# Patient Record
Sex: Male | Born: 2000 | Race: White | Hispanic: No | Marital: Single | Smoking: Former smoker
Health system: Southern US, Community
[De-identification: ages and names within clinical notes are randomized; demographics above are authoritative.]

## PROBLEM LIST (undated history)

## (undated) DIAGNOSIS — T7840XA Allergy, unspecified, initial encounter: Secondary | ICD-10-CM

## (undated) DIAGNOSIS — J309 Allergic rhinitis, unspecified: Secondary | ICD-10-CM

## (undated) DIAGNOSIS — L740 Miliaria rubra: Secondary | ICD-10-CM

## (undated) DIAGNOSIS — M303 Mucocutaneous lymph node syndrome [Kawasaki]: Secondary | ICD-10-CM

## (undated) HISTORY — PX: TONSILLECTOMY: SUR1361

## (undated) HISTORY — DX: Miliaria rubra: L74.0

## (undated) HISTORY — DX: Allergy, unspecified, initial encounter: T78.40XA

## (undated) HISTORY — PX: TESTICLE SURGERY: SHX794

---
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
## 2019-01-01 LAB — LIPID PANEL
CHOL/HDL RATIO: 2.9 ratio (ref 0.0–5.0)
Cholesterol, Total: 179 mg/dL — ABNORMAL HIGH (ref 100–169)
HDL: 61 mg/dL (ref >39)
LDL CALC: 83 mg/dL (ref 0–109)
TRIGLYCERIDES: 175 mg/dL — AB (ref 0–89)
VLDL CHOLESTEROL CAL: 35 mg/dL (ref 5–40)

## 2019-01-01 LAB — RENAL FUNCTION PANEL
ALBUMIN: 4.6 g/dL (ref 4.1–5.2)
BUN/Creatinine Ratio: 20 (ref 9–20)
BUN: 15 mg/dL (ref 6–20)
CO2: 22 mmol/L (ref 20–29)
CREATININE: 0.76 mg/dL (ref 0.76–1.27)
Calcium: 9.6 mg/dL (ref 8.7–10.2)
Chloride: 102 mmol/L (ref 96–106)
GFR calc Af Amer: 154 mL/min/{1.73_m2} (ref >59)
GFR, EST NON AFRICAN AMERICAN: 133 mL/min/{1.73_m2} (ref >59)
Glucose: 94 mg/dL (ref 65–99)
Phosphorus: 3.1 mg/dL — ABNORMAL LOW (ref 3.4–5.5)
Potassium: 4.3 mmol/L (ref 3.5–5.2)
SODIUM: 139 mmol/L (ref 134–144)

## 2019-01-08 ENCOUNTER — Ambulatory Visit (INDEPENDENT_AMBULATORY_CARE_PROVIDER_SITE_OTHER): Payer: Medicaid Other | Admitting: Family Medicine

## 2019-01-08 ENCOUNTER — Encounter: Payer: Self-pay | Admitting: Family Medicine

## 2019-01-08 VITALS — BP 120/80 | HR 80 | Ht 71.0 in | Wt 174.0 lb

## 2019-01-08 DIAGNOSIS — R059 Cough, unspecified: Secondary | ICD-10-CM

## 2019-01-08 DIAGNOSIS — J0140 Acute pansinusitis, unspecified: Secondary | ICD-10-CM

## 2019-01-08 DIAGNOSIS — R05 Cough: Secondary | ICD-10-CM | POA: Diagnosis not present

## 2019-01-08 MED ORDER — GUAIFENESIN-CODEINE 100-10 MG/5ML PO SYRP: 5.0000 mL | ORAL_SOLUTION | Freq: Three times a day (TID) | ORAL | 0 refills | Status: DC | PRN

## 2019-01-08 MED ORDER — AMOXICILLIN-POT CLAVULANATE 875-125 MG PO TABS: 1.0000 | ORAL_TABLET | Freq: Two times a day (BID) | ORAL | 0 refills | Status: DC

## 2019-01-08 NOTE — Progress Notes (Signed)
Date:  01/08/2019   Name:  Kenneth Lucas   DOB:  11-09-2001   MRN:  [id number]   Chief Complaint: Sinusitis (cough, headache, chills on and off)  Sinusitis  This is a new problem. The current episode started in the past 7 days (Sunday). The problem has been gradually worsening since onset. There has been no fever. Associated symptoms include chills, congestion, coughing, headaches, sinus pressure, sneezing and a sore throat. Pertinent negatives include no diaphoresis, ear pain, hoarse voice, neck pain, shortness of breath or swollen glands. (Prod yellow) Past treatments include oral decongestants (inhaler). The treatment provided mild relief.    Review of Systems  Constitutional: Positive for chills. Negative for diaphoresis and fever.  HENT: Positive for congestion, sinus pressure, sneezing and sore throat. Negative for drooling, ear discharge, ear pain and hoarse voice.   Respiratory: Positive for cough. Negative for shortness of breath and wheezing.   Cardiovascular: Negative for chest pain, palpitations and leg swelling.  Gastrointestinal: Negative for abdominal pain, blood in stool, constipation, diarrhea and nausea.  Endocrine: Negative for polydipsia.  Genitourinary: Negative for dysuria, frequency, hematuria and urgency.  Musculoskeletal: Negative for back pain, myalgias and neck pain.  Skin: Negative for rash.  Allergic/Immunologic: Negative for environmental allergies.  Neurological: Positive for headaches. Negative for dizziness.  Hematological: Does not bruise/bleed easily.  Psychiatric/Behavioral: Negative for suicidal ideas. The patient is not nervous/anxious.     Patient Active Problem List   Diagnosis Date Noted  . Kawasaki disease (HCC) 04/04/2013    No Known Allergies  Past Surgical History:  Procedure Laterality Date  . TESTICLE SURGERY    . TONSILLECTOMY      Social History   Tobacco Use  . Smoking status: Current Some Day Smoker    Types:  E-cigarettes  . Smokeless tobacco: Never Used  Substance Use Topics  . Alcohol use: Yes    Comment: minimal  . Drug use: Yes    Types: Marijuana     Medication list has been reviewed and updated.  Current Meds  Medication Sig  . albuterol (PROVENTIL HFA;VENTOLIN HFA) 108 (90 Base) MCG/ACT inhaler Inhale 2 puffs into the lungs every 6 (six) hours as needed for wheezing or shortness of breath.  . fexofenadine (ALLEGRA) 180 MG tablet Take 180 mg by mouth daily. otc  . fluticasone (FLONASE) 50 MCG/ACT nasal spray Place 2 sprays into both nostrils daily as needed for allergies or rhinitis.    PHQ 2/9 Scores 12/03/2018  PHQ - 2 Score 0  PHQ- 9 Score 4    Physical Exam Vitals signs and nursing note reviewed.  HENT:     Head: Normocephalic.     Jaw: There is normal jaw occlusion.     Right Ear: Hearing, tympanic membrane, ear canal and external ear normal.     Left Ear: Hearing, tympanic membrane, ear canal and external ear normal.     Nose:     Right Turbinates: Swollen.     Left Turbinates: Swollen.     Right Sinus: Maxillary sinus tenderness and frontal sinus tenderness present.     Left Sinus: Maxillary sinus tenderness and frontal sinus tenderness present.  Eyes:     General: No scleral icterus.       Right eye: No discharge.        Left eye: No discharge.     Conjunctiva/sclera: Conjunctivae normal.     Pupils: Pupils are equal, round, and reactive to light.  Neck:  Musculoskeletal: Normal range of motion and neck supple.     Thyroid: No thyromegaly.     Vascular: No JVD.     Trachea: No tracheal deviation.  Cardiovascular:     Rate and Rhythm: Normal rate and regular rhythm.     Heart sounds: Normal heart sounds. No murmur. No friction rub. No gallop.   Pulmonary:     Effort: No respiratory distress.     Breath sounds: Normal breath sounds. No decreased breath sounds, wheezing, rhonchi or rales.  Abdominal:     General: Bowel sounds are normal.     Palpations:  Abdomen is soft. There is no mass.     Tenderness: There is no abdominal tenderness. There is no guarding or rebound.  Musculoskeletal: Normal range of motion.        General: No tenderness.  Lymphadenopathy:     Head:     Right side of head: Submandibular adenopathy present.     Left side of head: Submandibular adenopathy present.     Cervical: No cervical adenopathy.     Right cervical: No superficial or posterior cervical adenopathy.    Left cervical: No superficial or posterior cervical adenopathy.  Skin:    General: Skin is warm.     Findings: No rash.  Neurological:     Mental Status: He is alert and oriented to person, place, and time.     Cranial Nerves: No cranial nerve deficit.     Deep Tendon Reflexes: Reflexes are normal and symmetric.     BP 120/80   Pulse 80   Ht 5\' 11"  (1.803 m)   Wt 174 lb (78.9 kg)   BMI 24.27 kg/m   Assessment and Plan: 1. Acute pansinusitis, recurrence not specified Acute.  Persistent.  Will initiate Augmentin 875 twice a day for 10 days - amoxicillin-clavulanate (AUGMENTIN) 875-125 MG tablet; Take 1 tablet by mouth 2 (two) times daily.  Dispense: 20 tablet; Refill: 0  2. Cough Recurrent cough particularly nightly.  Will initiate Robitussin-AC 1 teaspoon every 6 hours as needed cough. - guaiFENesin-codeine (ROBITUSSIN AC) 100-10 MG/5ML syrup; Take 5 mLs by mouth 3 (three) times daily as needed for cough.  Dispense: 118 mL; Refill: 0

## 2019-02-12 DIAGNOSIS — M791 Myalgia, unspecified site: Secondary | ICD-10-CM | POA: Diagnosis not present

## 2019-02-12 DIAGNOSIS — J029 Acute pharyngitis, unspecified: Secondary | ICD-10-CM | POA: Diagnosis not present

## 2019-02-12 DIAGNOSIS — R509 Fever, unspecified: Secondary | ICD-10-CM | POA: Diagnosis not present

## 2019-02-12 DIAGNOSIS — K529 Noninfective gastroenteritis and colitis, unspecified: Secondary | ICD-10-CM | POA: Diagnosis not present

## 2019-02-17 ENCOUNTER — Other Ambulatory Visit: Payer: Self-pay

## 2019-02-17 ENCOUNTER — Ambulatory Visit
Admission: EM | Admit: 2019-02-17 | Discharge: 2019-02-17 | Disposition: A | Discharge status: Home / Self Care | Payer: Medicaid Other | Attending: Emergency Medicine | Admitting: Emergency Medicine

## 2019-02-17 ENCOUNTER — Ambulatory Visit: Payer: Medicaid Other

## 2019-02-17 ENCOUNTER — Encounter: Payer: Self-pay | Admitting: Gynecology

## 2019-02-17 DIAGNOSIS — R1012 Left upper quadrant pain: Secondary | ICD-10-CM | POA: Diagnosis present

## 2019-02-17 DIAGNOSIS — K5900 Constipation, unspecified: Secondary | ICD-10-CM | POA: Diagnosis not present

## 2019-02-17 LAB — COMPREHENSIVE METABOLIC PANEL
ALBUMIN: 4.6 g/dL (ref 3.5–5.0)
ALK PHOS: 61 U/L (ref 38–126)
ALT: 25 U/L (ref 0–44)
AST: 22 U/L (ref 15–41)
Anion gap: 7 (ref 5–15)
BILIRUBIN TOTAL: 0.5 mg/dL (ref 0.3–1.2)
BUN: 15 mg/dL (ref 6–20)
CALCIUM: 8.7 mg/dL — AB (ref 8.9–10.3)
CO2: 24 mmol/L (ref 22–32)
Chloride: 101 mmol/L (ref 98–111)
Creatinine, Ser: 0.66 mg/dL (ref 0.61–1.24)
GFR calc Af Amer: >60 mL/min (ref >60)
GFR calc non Af Amer: >60 mL/min (ref >60)
Glucose, Bld: 101 mg/dL — ABNORMAL HIGH (ref 70–99)
Potassium: 3.7 mmol/L (ref 3.5–5.1)
Sodium: 132 mmol/L — ABNORMAL LOW (ref 135–145)
TOTAL PROTEIN: 7.9 g/dL (ref 6.5–8.1)

## 2019-02-17 LAB — CBC WITH DIFFERENTIAL/PLATELET
ABS IMMATURE GRANULOCYTES: 0.02 10*3/uL (ref 0.00–0.07)
Basophils Absolute: 0 10*3/uL (ref 0.0–0.1)
Basophils Relative: 1 %
Eosinophils Absolute: 0.2 10*3/uL (ref 0.0–0.5)
Eosinophils Relative: 3 %
HEMATOCRIT: 44.1 % (ref 39.0–52.0)
HEMOGLOBIN: 15.1 g/dL (ref 13.0–17.0)
IMMATURE GRANULOCYTES: 0 %
LYMPHS ABS: 1.8 10*3/uL (ref 0.7–4.0)
Lymphocytes Relative: 29 %
MCH: 28 pg (ref 26.0–34.0)
MCHC: 34.2 g/dL (ref 30.0–36.0)
MCV: 81.7 fL (ref 80.0–100.0)
Monocytes Absolute: 0.5 10*3/uL (ref 0.1–1.0)
Monocytes Relative: 7 %
NEUTROS ABS: 3.6 10*3/uL (ref 1.7–7.7)
NEUTROS PCT: 60 %
Platelets: 231 10*3/uL (ref 150–400)
RBC: 5.4 MIL/uL (ref 4.22–5.81)
RDW: 12.4 % (ref 11.5–15.5)
WBC: 6.1 10*3/uL (ref 4.0–10.5)
nRBC: 0 % (ref 0.0–0.2)

## 2019-02-17 LAB — LIPASE, BLOOD: Lipase: 30 U/L (ref 11–51)

## 2019-02-17 MED ORDER — PANTOPRAZOLE SODIUM 20 MG PO TBEC: 20.0000 mg | DELAYED_RELEASE_TABLET | Freq: Every day | ORAL | 0 refills | Status: DC

## 2019-02-17 MED ORDER — IOHEXOL 300 MG/ML  SOLN
100.0000 mL | Freq: Once | INTRAMUSCULAR | Status: AC | PRN
  Administered 2019-02-17: 100 mL via INTRAVENOUS

## 2019-02-17 MED ORDER — ONDANSETRON 8 MG PO TBDP
8.0000 mg | ORAL_TABLET | Freq: Once | ORAL | Status: AC
  Administered 2019-02-17: 8 mg via ORAL

## 2019-02-17 MED ORDER — IBUPROFEN 600 MG PO TABS: 600.0000 mg | ORAL_TABLET | Freq: Four times a day (QID) | ORAL | 0 refills | Status: DC | PRN

## 2019-02-17 MED ORDER — KETOROLAC TROMETHAMINE 60 MG/2ML IM SOLN
30.0000 mg | Freq: Once | INTRAMUSCULAR | Status: AC
  Administered 2019-02-17: 30 mg via INTRAMUSCULAR

## 2019-02-17 MED ORDER — FAMOTIDINE 20 MG PO TABS: 20.0000 mg | ORAL_TABLET | Freq: Two times a day (BID) | ORAL | 0 refills | Status: DC

## 2019-02-17 NOTE — ED Triage Notes (Signed)
Per patient was seen at [facility] ER x 1 week for stomach pain. Per patient was tested flu / strep and blood work  which was negative. Per patient still having the stomach issue

## 2019-02-17 NOTE — Discharge Instructions (Signed)
Try the Pepcid and Protonix, and eating a healthier diet.  600 mg ibuprofen combined with 1 g of Tylenol together 3-4 times a day as needed for pain.  You can try MiraLAX as well.  Go immediately to the ER for pain not controlled with Tylenol ibuprofen, fevers above 100.4, or for the other signs and symptoms we discussed

## 2019-02-17 NOTE — ED Provider Notes (Signed)
HPI  SUBJECTIVE:  Kenneth Lucas is a 18 y.o. male who presents with intermittent, nonmigratory, nonradiating left upper quadrant/midline 10 out of 10 abdominal pain starting last night.  He reports accompanying nausea.  He describes the pain as stabbing, burning, sore.  It lasts 15 to 20 minutes and then resolves.  She states that he had similar pain when he was seen in [facility] ED 5 days ago.  The abdominal pain is not getting worse, but it is not going away.  Denies coughing, wheezing, fevers, diarrhea, vomiting.  No chest pain.  He reports some shortness of breath secondary to the pain.  He states that he feels bloated.  No urinary complaints.  Had a normal bowel movement this morning although states it was slightly less than usual.  No melena, hematochezia.  No antipyretic in the past 4 to 6 hours, change in his diet.  States that he eats a lot of fast food.  No sick contacts, questionable leftovers.  States he had a sore throat earlier, but this has since resolved.  He has tried Zofran, Flexeril, and over-the-counter nausea medicine.  Symptoms are better when he smokes marijuana.  No aggravating factors.  The abdominal pain is not associated with eating, drinking, movement, exertion or fasting.  He states that the car ride over here was mildly painful.  He denies alcohol last night.  States that he drinks several shots of liquor once a week every 2 or 3 weeks.  He denies regular NSAID use.  He has a past medical history of hypertension, mono, tonsillectomy, hypercholesterolemia.  No history of peptic ulcer disease, gallbladder disease, pancreatitis, abdominal surgeries, atrial fibrillation, mesenteric ischemia, hypercoagulability, diabetes, MI, coronary artery disease.  Family history positive for sister and grandmother for gallbladder disease.  ZOX:WRUEA, Deanna C, MD  He is also reporting continued mild bilateral low back pain that gets better with Flexeril.  he was seen in the [facility] ED 5 days  ago for flulike symptoms of sore throat, subjective fevers, body aches and nausea, diarrhea, vomiting.   CMP, UA, rapid flu, strep were negative. CBC significant for thrombocytopenia and a 2+ left shift without leukocytosis.  No complaints of abdominal pain noted in the chart.  He was thought to have gastroenteritis and pharyngitis, sent home with Zofran, Flexeril.  Past Medical History:  Diagnosis Date  . Allergic rhinitis   . Allergy   . Heat rash   . 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 TESTICLE SURGERY    . TONSILLECTOMY      Family History  Problem Relation Age of Onset  . Diabetes Father   . Heart disease Maternal Grandmother   . Hypertension Maternal Grandmother   . Heart disease Maternal Grandfather   . Hypertension Maternal Grandfather     Social History   Tobacco Use  . Smoking status: Current Some Day Smoker    Types: E-cigarettes  . Smokeless tobacco: Never Used  Substance Use Topics  . Alcohol use: Yes    Comment: minimal  . Drug use: Yes    Types: Marijuana    No current facility-administered medications for this encounter.   Current Outpatient Medications:  .  albuterol (PROVENTIL HFA;VENTOLIN HFA) 108 (90 Base) MCG/ACT inhaler, Inhale 2 puffs into the lungs every 6 (six) hours as needed for wheezing or shortness of breath., Disp: 1 Inhaler, Rfl: 2 .  cyclobenzaprine (FLEXERIL) 10 MG tablet, Take by mouth., Disp: , Rfl:  .  fexofenadine (ALLEGRA) 180 MG tablet, Take 180 mg by mouth daily. otc, Disp: , Rfl:  .  fluticasone (FLONASE) 50 MCG/ACT nasal spray, Place 2 sprays into both nostrils daily as needed for allergies or rhinitis., Disp: , Rfl:  .  ondansetron (ZOFRAN) 8 MG tablet, Take by mouth every 8 (eight) hours as needed for nausea or vomiting., Disp: , Rfl:  .  famotidine (PEPCID) 20 MG tablet, Take 1 tablet (20 mg total) by mouth 2 (two) times daily., Disp: 40 tablet, Rfl: 0 .  ibuprofen (ADVIL,MOTRIN)  600 MG tablet, Take 1 tablet (600 mg total) by mouth every 6 (six) hours as needed., Disp: 30 tablet, Rfl: 0 .  pantoprazole (PROTONIX) 20 MG tablet, Take 1 tablet (20 mg total) by mouth daily., Disp: 30 tablet, Rfl: 0  No Known Allergies   ROS  As noted in HPI.   Physical Exam  BP (!) 140/91 (BP Location: Left Arm)   Pulse 63   Temp 98.3 F (36.8 C) (Oral)   Resp 16   Ht  (1.778 m)   Wt 77.1 kg   SpO2 98%   BMI 24.39 kg/m   Constitutional: Well developed, well nourished, no acute distress.  Moving around the room comfortably Eyes:  EOMI, conjunctiva normal bilaterally HENT: Normocephalic, atraumatic,mucus membranes moist Respiratory: Normal inspiratory effort, lungs clear bilaterally.  Good air movement. Cardiovascular: Normal rate regular rhythm, no murmurs rubs or gallops GI: Normal appearance, soft, nondistended.  Diffuse abdominal tenderness except in the left upper quadrant.  No guarding, rebound.  Active bowel sounds.  No appreciable splenomegaly.  Negative Murphy. Back: No CVAT.  Mild bilateral paralumbar tenderness.  No L-spine, SI joint tenderness. skin: No rash, skin intact Musculoskeletal: no deformities Neurologic: Alert & oriented x 3, no focal neuro deficits Psychiatric: Speech and behavior appropriate   ED Course   Medications  ketorolac (TORADOL) injection 30 mg (30 mg Intramuscular Given 02/17/19 0916)  ondansetron (ZOFRAN-ODT) disintegrating tablet 8 mg (8 mg Oral Given 02/17/19 0915)  iohexol (OMNIPAQUE) 300 MG/ML solution 100 mL (100 mLs Intravenous Contrast Given 02/17/19 0937)    Orders Placed This Encounter  Procedures  . CT ABDOMEN PELVIS W CONTRAST    Standing Status:   Standing    Number of Occurrences:   1    Order Specific Question:   Does the patient have a contrast media/X-ray dye allergy?    Answer:   No    Order Specific Question:   If indicated for the ordered procedure, I authorize the administration of contrast media per  Radiology protocol    Answer:   Yes    Order Specific Question:   Is Oral Contrast requested for this exam?    Answer:   No oral contrast    Order Specific Question:   Reason for No Oral Contrast    Answer:   Medical necessity (Time Sensitive)    Order Specific Question:   Call Results- Best Contact Number?    Answer:   [phone]    Order Specific Question:   Radiology Contrast Protocol - do NOT remove file path    Answer:   [email or web address]  . CBC with Differential    Standing Status:   Standing    Number of Occurrences:   1  . Lipase, blood    Standing Status:   Standing    Number of Occurrences:   1  . Comprehensive metabolic panel    Standing Status:   Standing    Number  of Occurrences:   1    Results for orders placed or performed during the hospital encounter of 02/17/19 (from the past 24 hour(s))  CBC with Differential     Status: None   Collection Time: 02/17/19  9:18 AM  Result Value Ref Range   WBC 6.1 4.0 - 10.5 K/uL   RBC 5.40 4.22 - 5.81 MIL/uL   Hemoglobin 15.1 13.0 - 17.0 g/dL   HCT 57.8 46.9 - 62.9 %   MCV 81.7 80.0 - 100.0 fL   MCH 28.0 26.0 - 34.0 pg   MCHC 34.2 30.0 - 36.0 g/dL   RDW 52.8 41.3 - 24.4 %   Platelets 231 150 - 400 K/uL   nRBC 0.0 0.0 - 0.2 %   Neutrophils Relative % 60 %   Neutro Abs 3.6 1.7 - 7.7 K/uL   Lymphocytes Relative 29 %   Lymphs Abs 1.8 0.7 - 4.0 K/uL   Monocytes Relative 7 %   Monocytes Absolute 0.5 0.1 - 1.0 K/uL   Eosinophils Relative 3 %   Eosinophils Absolute 0.2 0.0 - 0.5 K/uL   Basophils Relative 1 %   Basophils Absolute 0.0 0.0 - 0.1 K/uL   Immature Granulocytes 0 %   Abs Immature Granulocytes 0.02 0.00 - 0.07 K/uL  Lipase, blood     Status: None   Collection Time: 02/17/19  9:18 AM  Result Value Ref Range   Lipase 30 11 - 51 U/L  Comprehensive metabolic panel     Status: Abnormal   Collection Time: 02/17/19  9:18 AM  Result Value Ref Range   Sodium 132 (L) 135 - 145 mmol/L    Potassium 3.7 3.5 - 5.1 mmol/L   Chloride 101 98 - 111 mmol/L   CO2 24 22 - 32 mmol/L   Glucose, Bld 101 (H) 70 - 99 mg/dL   BUN 15 6 - 20 mg/dL   Creatinine, Ser 0.10 0.61 - 1.24 mg/dL   Calcium 8.7 (L) 8.9 - 10.3 mg/dL   Total Protein 7.9 6.5 - 8.1 g/dL   Albumin 4.6 3.5 - 5.0 g/dL   AST 22 15 - 41 U/L   ALT 25 0 - 44 U/L   Alkaline Phosphatase 61 38 - 126 U/L   Total Bilirubin 0.5 0.3 - 1.2 mg/dL   GFR calc non Af Amer >60 >60 mL/min   GFR calc Af Amer >60 >60 mL/min   Anion gap 7 5 - 15   Ct Abdomen Pelvis W Contrast  Result Date: 02/17/2019 CLINICAL DATA:  Diffuse abdominal pain with nausea, constipation and decreased appetite. Previous inguinal hernia repair. EXAM: CT ABDOMEN AND PELVIS WITH CONTRAST TECHNIQUE: Multidetector CT imaging of the abdomen and pelvis was performed using the standard protocol following bolus administration of intravenous contrast. CONTRAST:  OMNIPAQUE IOHEXOL 300 MG/ML  SOLN COMPARISON:  None. FINDINGS: Lower chest: Normal Hepatobiliary: Normal Pancreas: Normal Spleen: Normal Adrenals/Urinary Tract: Adrenal glands are normal. Kidneys are normal. Insignificant 1 cm cyst on the left. Bladder is normal. Stomach/Bowel: No abnormal bowel finding. No evidence of ileus or obstruction. No sign of inflammatory disease. Appendix looks normal. Vascular/Lymphatic: Normal Reproductive: Normal Other: No free fluid or air. Musculoskeletal: Normal IMPRESSION: Normal CT scan. No abnormality seen to explain the presenting symptoms. Electronically Signed   By: Paulina Fusi M.D.   On: 02/17/2019 10:05    ED Clinical Impression  Left upper quadrant pain   ED Assessment/Plan  ER records, outside labs reviewed.  As noted in HPI.  Doubt mesenteric ischemia as it is not associated with eating. he has no history of hypercoagulability, atrial fibrillation.  He has diffuse tenderness, but no guarding or rebound.  He has had a history of mono, so not sure that repeat testing  would be beneficial.  We will check a CBC, CMP, lipase.  Giving Zofran 8 mg ODT and Toradol 30 mg IM as patient is reporting pain and nausea.  He has no urinary complaints, UA was deferred.  Labs reviewed.  CMP, CBC, lipase unremarkable.  The thrombocytopenia has resolved.  Reviewed imaging independently.  Normal abdominal CT.  See radiology report for details.  On reevaluation, patient states that he feels significantly better.  Repeat abdominal exam unchanged, with mild diffuse tenderness, but no distention, no guarding, rebound.  Discussed labs and imaging with patient.  Unsure as to the etiology of his symptoms, but will send him home with ibuprofen 600 mg combined with 1 g of Tylenol because it seems to respond to NSAIDs, continue Zofran, start some Pepcid and some Protonix, and try some MiraLAX.  Follow-up with his PMD in several days, gave him strict ER return precautions.  Discussed labs, imaging, MDM, treatment plan, and plan for follow-up with patient. Discussed sn/sx that should prompt return to the ED. patient agrees with plan.   Meds ordered this encounter  Medications  . ketorolac (TORADOL) injection 30 mg  . ondansetron (ZOFRAN-ODT) disintegrating tablet 8 mg  . iohexol (OMNIPAQUE) 300 MG/ML solution 100 mL  . ibuprofen (ADVIL,MOTRIN) 600 MG tablet    Sig: Take 1 tablet (600 mg total) by mouth every 6 (six) hours as needed.    Dispense:  30 tablet    Refill:  0  . pantoprazole (PROTONIX) 20 MG tablet    Sig: Take 1 tablet (20 mg total) by mouth daily.    Dispense:  30 tablet    Refill:  0  . famotidine (PEPCID) 20 MG tablet    Sig: Take 1 tablet (20 mg total) by mouth 2 (two) times daily.    Dispense:  40 tablet    Refill:  0    *This clinic note was created using Scientist, clinical (histocompatibility and immunogenetics). Therefore, there may be occasional mistakes despite careful proofreading.   ?   Domenick Gong, MD 02/18/19 0830

## 2019-07-20 IMAGING — CT CT ABDOMEN AND PELVIS WITH CONTRAST
1 of 2 series · 15 of 32 positions shown, 19 images · IV contrast (APPLIED)
Comparison: None.

CLINICAL DATA: Diffuse abdominal pain with nausea, constipation and
decreased appetite. Previous inguinal hernia repair.

EXAM:
CT ABDOMEN AND PELVIS WITH CONTRAST
TECHNIQUE: Multidetector CT imaging of the abdomen and pelvis was performed
using the standard protocol following bolus administration of
intravenous contrast.
CONTRAST:  100mL OMNIPAQUE IOHEXOL 300 MG/ML  SOLN

[Series 2: axial st · axial · 0.73mm/px · z∈[-1018,-582]mm · 15 of 95 slices shown, 19 images]
[im 4/95  soft-tissue]
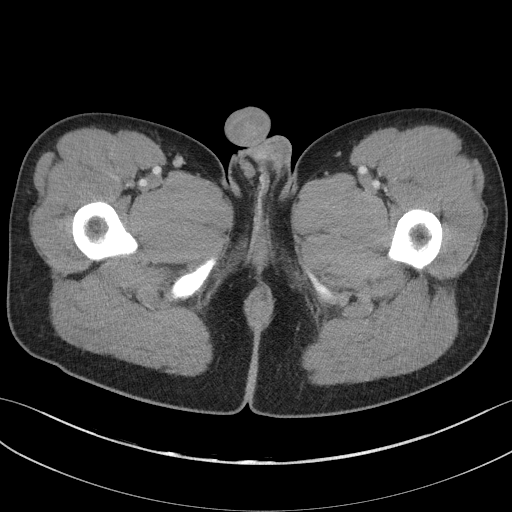
[im 4/95  bone]
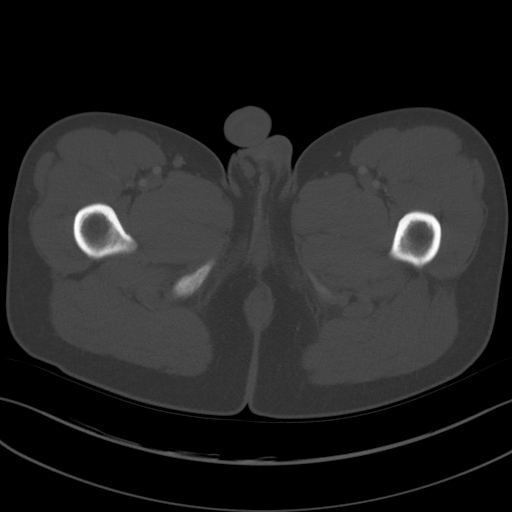
[im 12/95  soft-tissue]
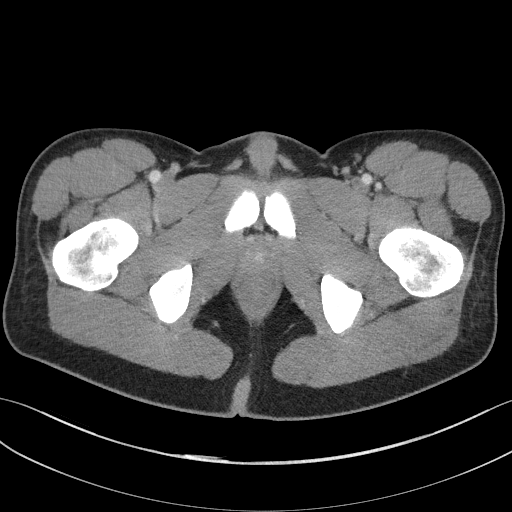
[im 19/95  soft-tissue]
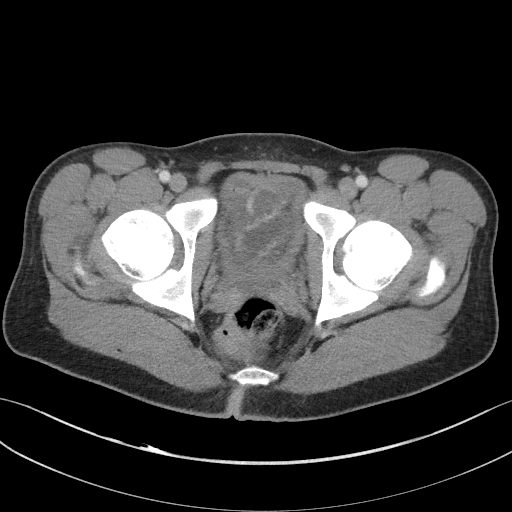
[im 27/95  soft-tissue]
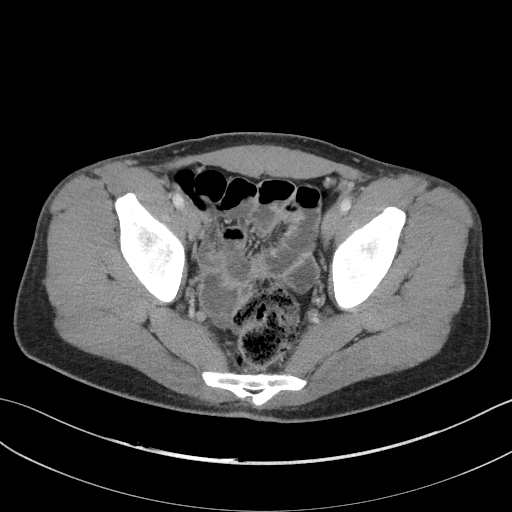
[im 34/95  soft-tissue]
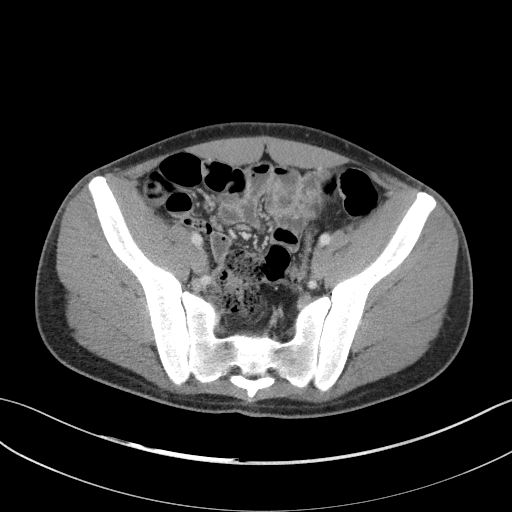
[im 42/95  soft-tissue]
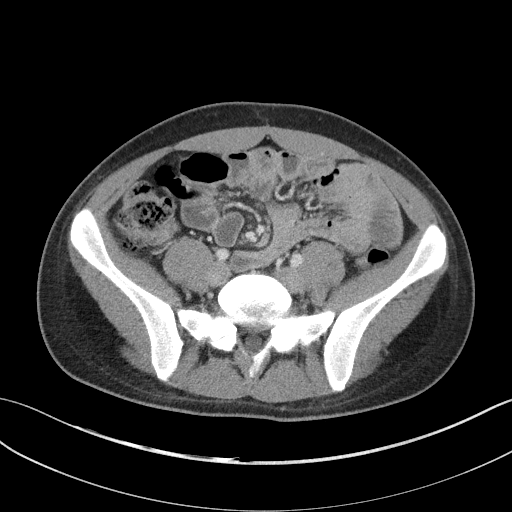
[im 49/95  soft-tissue]
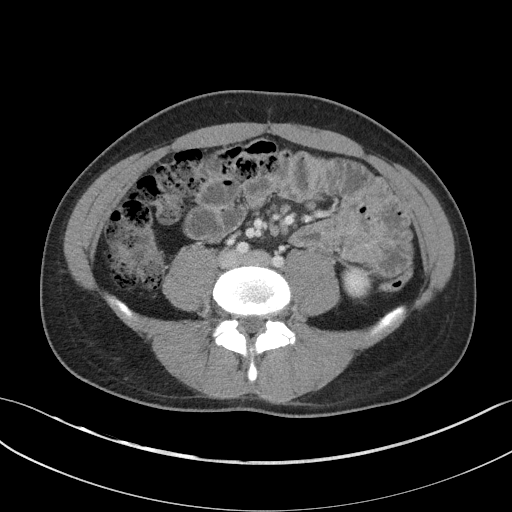
[im 53/95  soft-tissue]
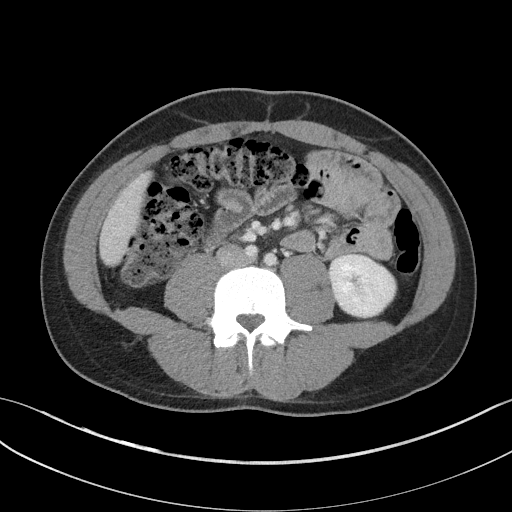
[im 61/95  soft-tissue]
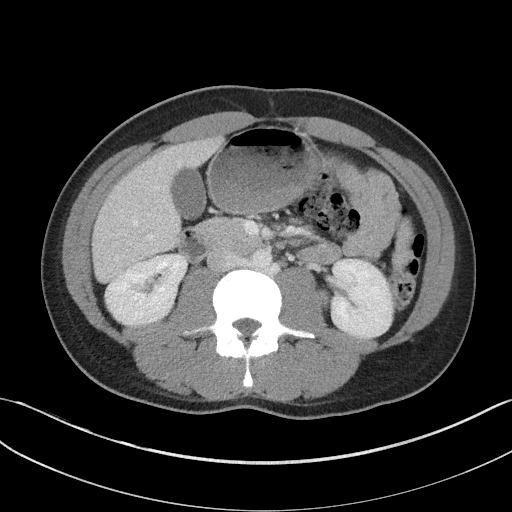
[im 61/95  bone]
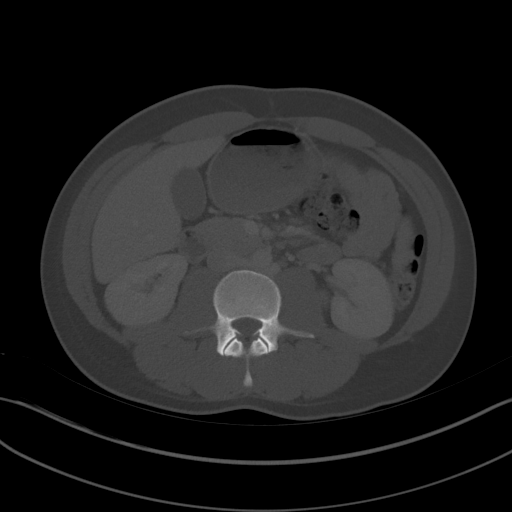
[im 68/95  soft-tissue]
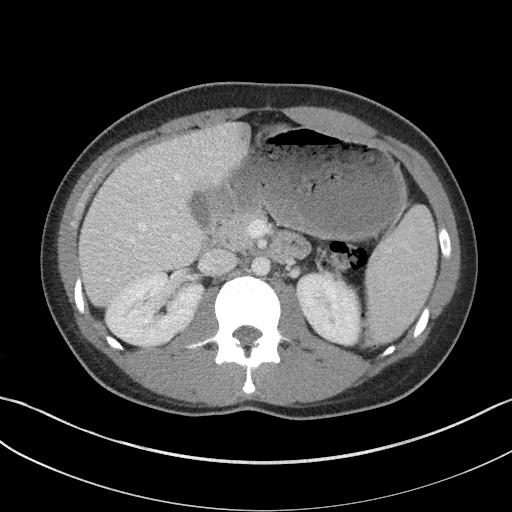
[im 76/95  soft-tissue]
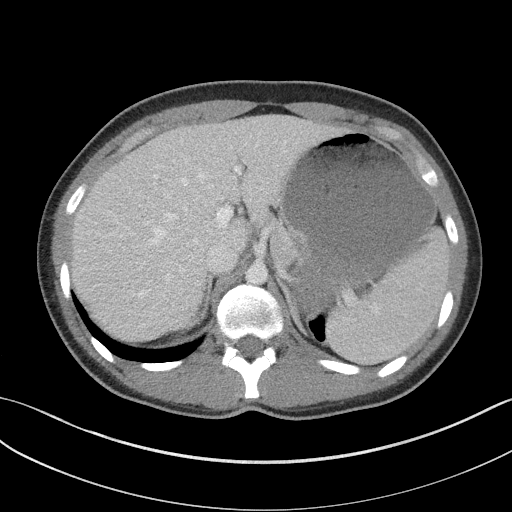
[im 79/95  lung]
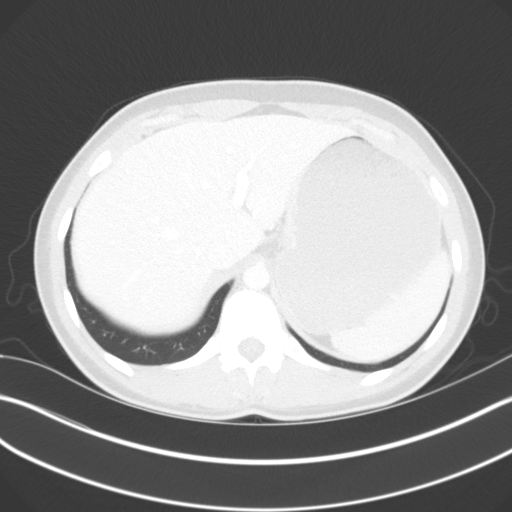
[im 83/95  soft-tissue]
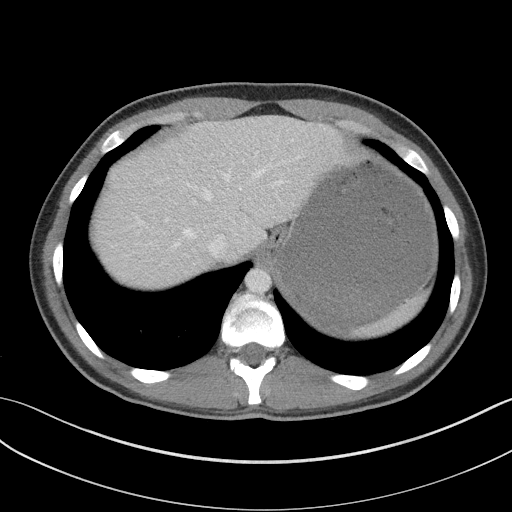
[im 83/95  lung]
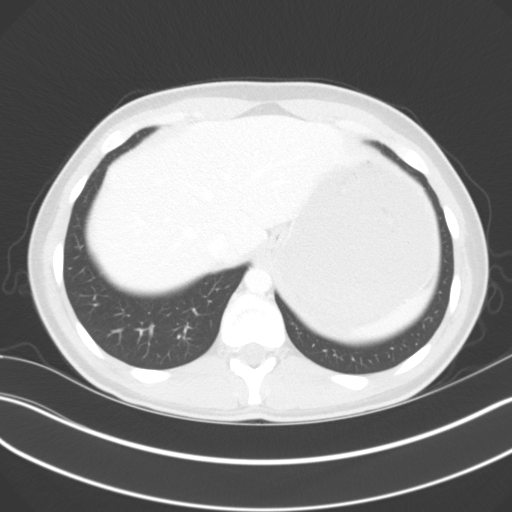
[im 87/95  lung]
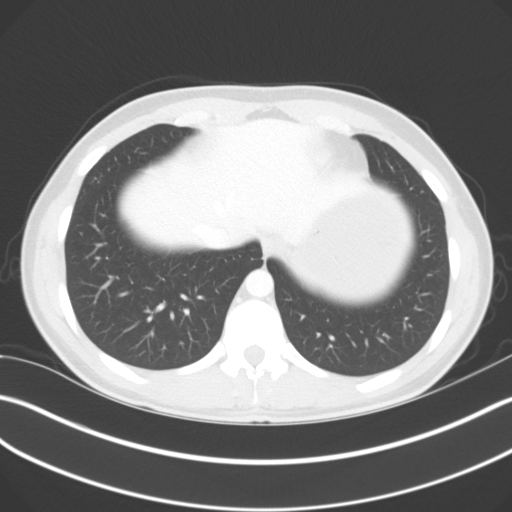
[im 91/95  soft-tissue]
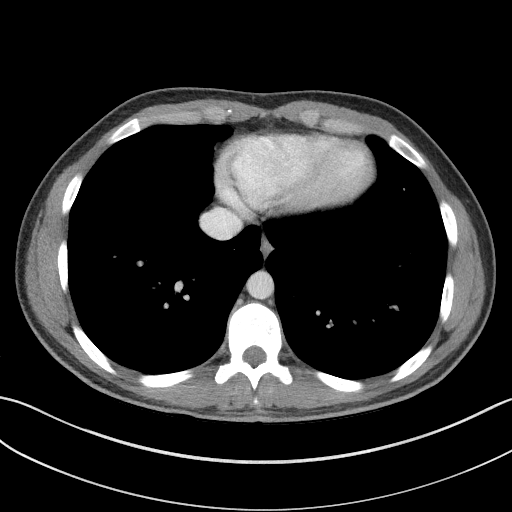
[im 91/95  lung]
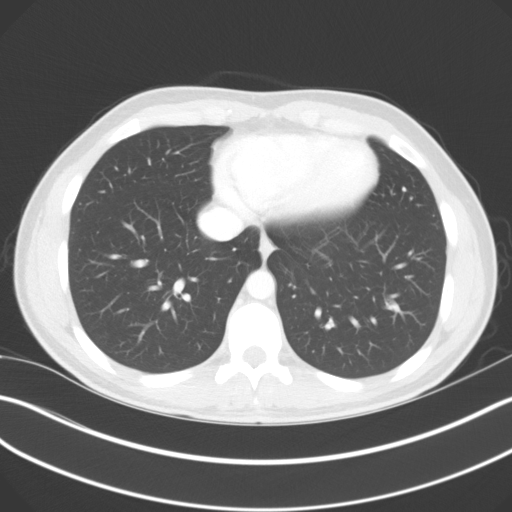

[15 of 32 positions shown; findings below may reference images not displayed]

FINDINGS: Lower chest: Normal

Hepatobiliary: Normal

Pancreas: Normal

Spleen: Normal

Adrenals/Urinary Tract: Adrenal glands are normal. Kidneys are
normal. Insignificant 1 cm cyst on the left. Bladder is normal.

Stomach/Bowel: No abnormal bowel finding. No evidence of ileus or
obstruction. No sign of inflammatory disease. Appendix looks normal.

Vascular/Lymphatic: Normal

Reproductive: Normal

Other: No free fluid or air.

Musculoskeletal: Normal
IMPRESSION: Normal CT scan. No abnormality seen to explain the presenting
symptoms.

## 2019-08-22 DIAGNOSIS — H5213 Myopia, bilateral: Secondary | ICD-10-CM | POA: Diagnosis not present

## 2019-08-23 DIAGNOSIS — H5213 Myopia, bilateral: Secondary | ICD-10-CM | POA: Diagnosis not present

## 2019-11-06 DIAGNOSIS — H52223 Regular astigmatism, bilateral: Secondary | ICD-10-CM | POA: Diagnosis not present

## 2020-01-14 ENCOUNTER — Ambulatory Visit (INDEPENDENT_AMBULATORY_CARE_PROVIDER_SITE_OTHER): Payer: BC Managed Care – PPO | Admitting: Family Medicine

## 2020-01-14 ENCOUNTER — Encounter: Payer: Self-pay | Admitting: Family Medicine

## 2020-01-14 ENCOUNTER — Other Ambulatory Visit: Payer: Self-pay

## 2020-01-14 VITALS — BP 120/80 | HR 80 | Ht 70.0 in | Wt 203.0 lb

## 2020-01-14 DIAGNOSIS — R7309 Other abnormal glucose: Secondary | ICD-10-CM | POA: Diagnosis not present

## 2020-01-14 DIAGNOSIS — R55 Syncope and collapse: Secondary | ICD-10-CM

## 2020-01-14 DIAGNOSIS — R42 Dizziness and giddiness: Secondary | ICD-10-CM

## 2020-01-14 DIAGNOSIS — Z8739 Personal history of other diseases of the musculoskeletal system and connective tissue: Secondary | ICD-10-CM

## 2020-01-14 NOTE — Patient Instructions (Addendum)
Syncope  Syncope refers to a condition in which a person temporarily loses consciousness. Syncope may also be called fainting or passing out. It is caused by a sudden decrease in blood flow to the brain. Even though most causes of syncope are not dangerous, syncope can be a sign of a serious medical problem. Your health care provider may do tests to find the reason why you are having syncope. Signs that you may be about to faint include: Feeling dizzy or light-headed. Feeling nauseous. Seeing all white or all black in your field of vision. Having cold, clammy skin. If you faint, get medical help right away. Call your local emergency services (911 in the [place] Do not drive yourself to the hospital. Follow these instructions at home: Pay attention to any changes in your symptoms. Take these actions to stay safe and to help relieve your symptoms: Lifestyle Do not drive, use machinery, or play sports until your health care provider says it is okay. Do not drink alcohol. Do not use any products that contain nicotine or tobacco, such as cigarettes and e-cigarettes. If you need help quitting, ask your health care provider. Drink enough fluid to keep your urine pale yellow. General instructions Take over-the-counter and prescription medicines only as told by your health care provider. If you are taking blood pressure or heart medicine, get up slowly and take several minutes to sit and then stand. This can reduce dizziness or light-headedness. Have someone stay with you until you feel stable. If you start to feel like you might faint, lie down right away and raise (elevate) your feet above the level of your heart. Breathe deeply and steadily. Wait until all the symptoms have passed. Keep all follow-up visits as told by your health care provider. This is important. Get help right away if you: Have a severe headache. Faint once or repeatedly. Have pain in your chest, abdomen, or back. Have a very  fast or irregular heartbeat (palpitations). Have pain when you breathe. Are bleeding from your mouth or rectum, or you have black or tarry stool. Have a seizure. Are confused. Have trouble walking. Have severe weakness. Have vision problems. These symptoms may represent a serious problem that is an emergency. Do not wait to see if your symptoms will go away. Get medical help right away. Call your local emergency services (911 in the [place] Do not drive yourself to the hospital. Summary Syncope refers to a condition in which a person temporarily loses consciousness. It is caused by a sudden decrease in blood flow to the brain. Signs that you may be about to faint include dizziness, feeling light-headed, feeling nauseous, sudden vision changes, or cold, clammy skin. Although most causes of syncope are not dangerous, syncope can be a sign of a serious medical problem. If you faint, get medical help right away. This information is not intended to replace advice given to you by your health care provider. Make sure you discuss any questions you have with your health care provider. Document Revised: 11/03/2017 Document Reviewed: 10/30/2017 Elsevier Patient Education  2020 [employer]. Near-Syncope Near-syncope is when you suddenly feel like you might pass out (faint), but you do not actually lose consciousness. This may also be referred to as presyncope. During an episode of near-syncope, you may:  Feel dizzy, weak, or light-headed.  Feel nauseous.  See all white or all black in your field of vision, or see spots.  Have cold, clammy skin. This condition is caused by a sudden decrease  in blood flow to the brain. This decrease can result from various causes, but most of those causes are not dangerous. However, near-syncope may be a sign of a serious medical problem, so it is important to seek medical care. Follow these instructions at home: Medicines  Take over-the-counter and prescription  medicines only as told by your health care provider.  If you are taking blood pressure or heart medicine, get up slowly and take several minutes to sit and then stand. This can reduce dizziness. General instructions  Pay attention to any changes in your symptoms.  Talk with your health care provider about your symptoms. You may need to have testing to understand the cause of your near-syncope.  If you start to feel like you might faint, lie down right away and raise (elevate) your feet above the level of your heart. Breathe deeply and steadily. Wait until all of the symptoms have passed.  Have someone stay with you until you feel stable.  Do not drive, use machinery, or play sports until your health care provider says it is okay.  Drink enough fluid to keep your urine pale yellow.  Keep all follow-up visits as told by your health care provider. This is important. Get help right away if you:  Have a seizure.  Have unusual pain in your chest, abdomen, or back.  Faint once or repeatedly.  Have a severe headache.  Are bleeding from your mouth or rectum, or you have black or tarry stool.  Have a very fast or irregular heartbeat (palpitations).  Are confused.  Have trouble walking.  Have severe weakness.  Have vision problems. These symptoms may represent a serious problem that is an emergency. Do not wait to see if your symptoms will go away. Get medical help right away. Call your local emergency services (911 in the [place] Do not drive yourself to the hospital. Summary  Near-syncope is when you suddenly feel like you might pass out (faint), but you do not actually lose consciousness.  This condition is caused by a sudden decrease in blood flow to the brain. This decrease can result from various causes, but most of those causes are not dangerous.  Near-syncope may be a sign of a serious medical problem, so it is important to seek medical care. This information is not  intended to replace advice given to you by your health care provider. Make sure you discuss any questions you have with your health care provider. Document Revised: 03/15/2019 Document Reviewed: 10/10/2018 Elsevier Patient Education  2020 [employer] [facility] [place] [facility] patient instructions here.

## 2020-01-14 NOTE — Progress Notes (Signed)
Date:  01/14/2020   Name:  Kenneth Lucas   DOB:  Sep 15, 2001   MRN:  [id number]   Chief Complaint: Loss of Consciousness (had 3 episodes in a 30 minute time frame 6 days ago. Started with dizzy feeling. Was out approx 30 seconds. )  Loss of Consciousness This is a new problem. The current episode started in the past 7 days (wednesday). The problem occurs rarely. The problem has been waxing and waning. There was no loss of consciousness. Exacerbated by: sight of blood. Associated symptoms include diaphoresis, dizziness, light-headedness, nausea and vertigo. Pertinent negatives include no abdominal pain, auditory change, aura, back pain, bladder incontinence, bowel incontinence, chest pain, clumsiness, confusion, fever, focal sensory loss, focal weakness, headaches, malaise/fatigue, palpitations, slurred speech, visual change, vomiting or weakness. There is no history of CAD, DM, HTN, seizures or a sudden death in family.    Lab Results  Component Value Date   CREATININE 0.66 02/17/2019   BUN 15 02/17/2019   NA 132 (L) 02/17/2019   K 3.7 02/17/2019   CL 101 02/17/2019   CO2 24 02/17/2019   Lab Results  Component Value Date   CHOL 179 (H) 12/31/2018   HDL 61 12/31/2018   LDLCALC 83 12/31/2018   TRIG 175 (H) 12/31/2018   CHOLHDL 2.9 12/31/2018   No results found for: TSH No results found for: HGBA1C   Review of Systems  Constitutional: Positive for diaphoresis. Negative for chills, fever and malaise/fatigue.  HENT: Negative for drooling, ear discharge, ear pain, postnasal drip, rhinorrhea, sinus pressure, sinus pain and sore throat.   Respiratory: Negative for cough, shortness of breath and wheezing.   Cardiovascular: Positive for syncope. Negative for chest pain, palpitations and leg swelling.  Gastrointestinal: Positive for nausea. Negative for abdominal pain, blood in stool, bowel incontinence, constipation, diarrhea and vomiting.  Endocrine: Negative for polydipsia,  polyphagia and polyuria.  Genitourinary: Negative for bladder incontinence, dysuria, frequency, hematuria and urgency.  Musculoskeletal: Negative for back pain, myalgias and neck pain.  Skin: Negative for color change, pallor, rash and wound.  Allergic/Immunologic: Negative for environmental allergies.  Neurological: Positive for dizziness, vertigo and light-headedness. Negative for focal weakness, weakness and headaches.  Hematological: Does not bruise/bleed easily.  Psychiatric/Behavioral: Negative for confusion and suicidal ideas. The patient is not nervous/anxious.     Patient Active Problem List   Diagnosis Date Noted  . Kawasaki disease ([place]) 04/04/2013    No Known Allergies  Past Surgical History:  Procedure Laterality Date  . TESTICLE SURGERY    . TONSILLECTOMY      Social History   Tobacco Use  . Smoking status: Former Smoker    Types: E-cigarettes    Quit date: 01/06/2019    Years since quitting: 1.0  . Smokeless tobacco: Never Used  Substance Use Topics  . Alcohol use: Yes    Comment: minimal  . Drug use: Yes    Types: Marijuana     Medication list has been reviewed and updated.  Current Meds  Medication Sig  . fexofenadine (ALLEGRA) 180 MG tablet Take 180 mg by mouth daily. otc  . fluticasone (FLONASE) 50 MCG/ACT nasal spray Place 2 sprays into both nostrils daily as needed for allergies or rhinitis.  Marland Kitchen ibuprofen (ADVIL,MOTRIN) 600 MG tablet Take 1 tablet (600 mg total) by mouth every 6 (six) hours as needed.  . [DISCONTINUED] cyclobenzaprine (FLEXERIL) 10 MG tablet Take by mouth.    PHQ 2/9 Scores 12/03/2018  PHQ - 2 Score 0  PHQ- 9 Score 4    BP Readings from Last 3 Encounters:  01/14/20 120/80  02/17/19 (!) 140/91  01/08/19 120/80    Physical Exam  Wt Readings from Last 3 Encounters:  01/14/20 203 lb (92.1 kg) (93 %, Z= 1.47)*  02/17/19 170 lb (77.1 kg) (74 %, Z= 0.64)*  01/08/19 174 lb (78.9 kg) (78 %, Z= 0.79)*   * Growth percentiles  are based on CDC (Boys, 2-20 Years) data.    BP 120/80   Pulse 80   Ht 5\' 10"  (1.778 m)   Wt 203 lb (92.1 kg)   BMI 29.13 kg/m   Assessment and Plan: 1. Postural dizziness with near syncope Patient had 3 consecutive episodes of either near syncope or short syncopal type episodes.  This was associated with no palpitations, chest pain, but it was noted that he was diaphoretic, pallor, and nausea.  This is consistent with a vasovagal episode.  Patient does have similar episodes when he is inside of blood or if he has had pain.  We will check an EKG to make sure that there is no concern with hypertrophy.I have reviewed EKG which shows normal sinus rhythm, normal intervals, except with an occasional ectopic beat normal rhythm.  No ischemic changes such as ST wave or T wave concerns, Q waves, nor diminished R wave progression.  There is no evidence of any hypertrophy EKG criteria.. Comparison to previous EKG dated not applicable.  And we will look at a CBC and renal function panel to assess electrolytes. - EKG 12-Lead - CBC with Differential/Platelet - Renal function panel  2. Elevated glucose level Patient has had an occasional elevated glucose reading which we will do an A1c today to rule out early prediabetes. - Hemoglobin A1c 3. History of Kawasaki disease Addendum: Patient has a history of Kawasaki disease and pending his lab work we may refer to Dr. to see if we need to evaluate for ectasia or other coronary concerns.

## 2020-01-15 LAB — CBC WITH DIFFERENTIAL/PLATELET
Basophils Absolute: 0.1 10*3/uL (ref 0.0–0.2)
Basos: 1 %
EOS (ABSOLUTE): 0.2 10*3/uL (ref 0.0–0.4)
Eos: 3 %
Hematocrit: 44.8 % (ref 37.5–51.0)
Hemoglobin: 15.2 g/dL (ref 13.0–17.7)
Immature Grans (Abs): 0 10*3/uL (ref 0.0–0.1)
Immature Granulocytes: 0 %
Lymphocytes Absolute: 2.9 10*3/uL (ref 0.7–3.1)
Lymphs: 36 %
MCH: 27.3 pg (ref 26.6–33.0)
MCHC: 33.9 g/dL (ref 31.5–35.7)
MCV: 81 fL (ref 79–97)
Monocytes Absolute: 0.8 10*3/uL (ref 0.1–0.9)
Monocytes: 10 %
Neutrophils Absolute: 4.1 10*3/uL (ref 1.4–7.0)
Neutrophils: 50 %
Platelets: 257 10*3/uL (ref 150–450)
RBC: 5.56 x10E6/uL (ref 4.14–5.80)
RDW: 13 % (ref 11.6–15.4)
WBC: 8.1 10*3/uL (ref 3.4–10.8)

## 2020-01-15 LAB — RENAL FUNCTION PANEL
Albumin: 5.1 g/dL (ref 4.1–5.2)
BUN/Creatinine Ratio: 18 (ref 9–20)
BUN: 15 mg/dL (ref 6–20)
CO2: 21 mmol/L (ref 20–29)
Calcium: 9.9 mg/dL (ref 8.7–10.2)
Chloride: 101 mmol/L (ref 96–106)
Creatinine, Ser: 0.84 mg/dL (ref 0.76–1.27)
GFR calc Af Amer: 147 mL/min/{1.73_m2} (ref >59)
GFR calc non Af Amer: 127 mL/min/{1.73_m2} (ref >59)
Glucose: 92 mg/dL (ref 65–99)
Phosphorus: 3.5 mg/dL (ref 3.4–5.5)
Potassium: 4.4 mmol/L (ref 3.5–5.2)
Sodium: 140 mmol/L (ref 134–144)

## 2020-01-15 LAB — HEMOGLOBIN A1C
Est. average glucose Bld gHb Est-mCnc: 111 mg/dL
Hgb A1c MFr Bld: 5.5 % (ref 4.8–5.6)

## 2020-03-09 DIAGNOSIS — Z20822 Contact with and (suspected) exposure to covid-19: Secondary | ICD-10-CM | POA: Diagnosis not present

## 2020-03-09 DIAGNOSIS — R1013 Epigastric pain: Secondary | ICD-10-CM | POA: Diagnosis not present

## 2020-03-09 DIAGNOSIS — G8929 Other chronic pain: Secondary | ICD-10-CM | POA: Diagnosis not present

## 2020-03-09 DIAGNOSIS — R112 Nausea with vomiting, unspecified: Secondary | ICD-10-CM | POA: Diagnosis not present

## 2020-03-09 DIAGNOSIS — R1110 Vomiting, unspecified: Secondary | ICD-10-CM | POA: Diagnosis not present

## 2020-03-09 DIAGNOSIS — I10 Essential (primary) hypertension: Secondary | ICD-10-CM | POA: Diagnosis not present

## 2020-03-09 DIAGNOSIS — D72829 Elevated white blood cell count, unspecified: Secondary | ICD-10-CM | POA: Diagnosis not present

## 2020-03-09 DIAGNOSIS — F1290 Cannabis use, unspecified, uncomplicated: Secondary | ICD-10-CM | POA: Diagnosis not present

## 2020-03-09 DIAGNOSIS — J45909 Unspecified asthma, uncomplicated: Secondary | ICD-10-CM | POA: Diagnosis not present

## 2020-03-09 DIAGNOSIS — R197 Diarrhea, unspecified: Secondary | ICD-10-CM | POA: Diagnosis not present

## 2020-04-16 DIAGNOSIS — K5900 Constipation, unspecified: Secondary | ICD-10-CM | POA: Diagnosis not present

## 2020-04-16 DIAGNOSIS — R112 Nausea with vomiting, unspecified: Secondary | ICD-10-CM | POA: Diagnosis not present

## 2020-04-16 DIAGNOSIS — D72829 Elevated white blood cell count, unspecified: Secondary | ICD-10-CM | POA: Diagnosis not present

## 2020-04-16 DIAGNOSIS — J45909 Unspecified asthma, uncomplicated: Secondary | ICD-10-CM | POA: Diagnosis not present

## 2020-04-16 DIAGNOSIS — G8929 Other chronic pain: Secondary | ICD-10-CM | POA: Diagnosis not present

## 2020-04-16 DIAGNOSIS — R1013 Epigastric pain: Secondary | ICD-10-CM | POA: Diagnosis not present

## 2020-04-16 DIAGNOSIS — R1012 Left upper quadrant pain: Secondary | ICD-10-CM | POA: Diagnosis not present

## 2020-05-01 ENCOUNTER — Ambulatory Visit (INDEPENDENT_AMBULATORY_CARE_PROVIDER_SITE_OTHER): Payer: BC Managed Care – PPO | Admitting: Family Medicine

## 2020-05-01 ENCOUNTER — Encounter: Payer: Self-pay | Admitting: Family Medicine

## 2020-05-01 ENCOUNTER — Other Ambulatory Visit: Payer: Self-pay

## 2020-05-01 VITALS — BP 130/78 | HR 68 | Ht 70.0 in | Wt 207.0 lb

## 2020-05-01 DIAGNOSIS — R1012 Left upper quadrant pain: Secondary | ICD-10-CM

## 2020-05-01 MED ORDER — DICYCLOMINE HCL 10 MG PO CAPS: 10.0000 mg | ORAL_CAPSULE | Freq: Three times a day (TID) | ORAL | 1 refills | Status: DC

## 2020-05-01 NOTE — Progress Notes (Signed)
Date:  05/01/2020   Name:  Kenneth Lucas   DOB:  04-18-2001   MRN:  [id number]   Chief Complaint: Abdominal Pain (LUQ pain- wakes up in middle of night with pain lasting approx for 1-2 hours. Nausea and vomitting associated with pain. Been to the ER multiple times)  Abdominal Pain This is a recurrent problem. The current episode started more than 1 year ago. The problem occurs intermittently. The problem has been waxing and waning. The pain is located in the LUQ. The pain is moderate. The quality of the pain is colicky and burning. Associated symptoms include diarrhea and nausea. Pertinent negatives include no anorexia, constipation, dysuria, fever, frequency, headaches, hematochezia, hematuria, melena, myalgias or weight loss. Associated symptoms comments: nocturnal awakening. Exacerbated by: "heavy foods" The pain is relieved by being still. Treatments tried: bentyl. The treatment provided moderate relief. Prior diagnostic workup includes CT scan.    Lab Results  Component Value Date   CREATININE 0.84 01/14/2020   BUN 15 01/14/2020   NA 140 01/14/2020   K 4.4 01/14/2020   CL 101 01/14/2020   CO2 21 01/14/2020   Lab Results  Component Value Date   CHOL 179 (H) 12/31/2018   HDL 61 12/31/2018   LDLCALC 83 12/31/2018   TRIG 175 (H) 12/31/2018   CHOLHDL 2.9 12/31/2018   No results found for: TSH Lab Results  Component Value Date   HGBA1C 5.5 01/14/2020   Lab Results  Component Value Date   WBC 8.1 01/14/2020   HGB 15.2 01/14/2020   HCT 44.8 01/14/2020   MCV 81 01/14/2020   PLT 257 01/14/2020   Lab Results  Component Value Date   ALT 25 02/17/2019   AST 22 02/17/2019   ALKPHOS 61 02/17/2019   BILITOT 0.5 02/17/2019     Review of Systems  Constitutional: Negative for chills, fever and weight loss.  HENT: Negative for drooling, ear discharge, ear pain and sore throat.   Respiratory: Negative for cough, shortness of breath and wheezing.   Cardiovascular: Negative  for chest pain, palpitations and leg swelling.  Gastrointestinal: Positive for abdominal pain, diarrhea and nausea. Negative for anorexia, blood in stool, constipation, hematochezia and melena.  Endocrine: Negative for polydipsia.  Genitourinary: Negative for dysuria, frequency, hematuria and urgency.  Musculoskeletal: Negative for back pain, myalgias and neck pain.  Skin: Negative for rash.  Allergic/Immunologic: Negative for environmental allergies.  Neurological: Negative for dizziness and headaches.  Hematological: Does not bruise/bleed easily.  Psychiatric/Behavioral: Negative for suicidal ideas. The patient is not nervous/anxious.     Patient Active Problem List   Diagnosis Date Noted  . Kawasaki disease ([place]) 04/04/2013    No Known Allergies  Past Surgical History:  Procedure Laterality Date  . TESTICLE SURGERY    . TONSILLECTOMY      Social History   Tobacco Use  . Smoking status: Former Smoker    Types: E-cigarettes    Quit date: 01/06/2019    Years since quitting: 1.3  . Smokeless tobacco: Never Used  Substance Use Topics  . Alcohol use: Yes    Comment: minimal  . Drug use: Yes    Types: Marijuana     Medication list has been reviewed and updated.  Current Meds  Medication Sig  . fexofenadine (ALLEGRA) 180 MG tablet Take 180 mg by mouth daily. otc  . fluticasone (FLONASE) 50 MCG/ACT nasal spray Place 2 sprays into both nostrils daily as needed for allergies or rhinitis.  Marland Kitchen ibuprofen (  ADVIL,MOTRIN) 600 MG tablet Take 1 tablet (600 mg total) by mouth every 6 (six) hours as needed.    PHQ 2/9 Scores 12/03/2018  PHQ - 2 Score 0  PHQ- 9 Score 4    BP Readings from Last 3 Encounters:  05/01/20 130/78  01/14/20 120/80  02/17/19 (!) 140/91    Physical Exam Vitals and nursing note reviewed.  HENT:     Head: Normocephalic.     Right Ear: External ear normal.     Left Ear: External ear normal.     Nose: Nose normal.  Eyes:     General: No scleral  icterus.       Right eye: No discharge.        Left eye: No discharge.     Extraocular Movements: Extraocular movements intact.     Conjunctiva/sclera: Conjunctivae normal.     Pupils: Pupils are equal, round, and reactive to light.  Neck:     Thyroid: No thyromegaly.     Vascular: No JVD.     Trachea: No tracheal deviation.  Cardiovascular:     Rate and Rhythm: Normal rate and regular rhythm.     Heart sounds: Normal heart sounds. No murmur. No friction rub. No gallop.   Pulmonary:     Effort: No respiratory distress.     Breath sounds: Normal breath sounds. No wheezing or rales.  Abdominal:     General: Bowel sounds are normal.     Palpations: Abdomen is soft. There is no hepatomegaly, splenomegaly or mass.     Tenderness: There is no abdominal tenderness. There is no right CVA tenderness, left CVA tenderness, guarding or rebound.  Musculoskeletal:        General: No tenderness. Normal range of motion.     Cervical back: Normal range of motion and neck supple.  Lymphadenopathy:     Cervical: No cervical adenopathy.  Skin:    General: Skin is warm.     Findings: No rash.  Neurological:     Mental Status: He is alert and oriented to person, place, and time.     Cranial Nerves: No cranial nerve deficit.     Deep Tendon Reflexes: Reflexes are normal and symmetric.     Wt Readings from Last 3 Encounters:  05/01/20 207 lb (93.9 kg)  01/14/20 203 lb (92.1 kg) (93 %, Z= 1.47)*  02/17/19 170 lb (77.1 kg) (74 %, Z= 0.64)*   * Growth percentiles are based on CDC (Boys, 2-20 Years) data.    BP 130/78   Pulse 68   Ht 5\' 10"  (1.778 m)   Wt 207 lb (93.9 kg)   BMI 29.70 kg/m   Assessment and Plan: 1. Left upper quadrant abdominal pain New onset.  Recurrent.  Stable.  Patient has episodic periods of left upper quadrant pain associated with nausea and occasionally diarrhea.  Patient has been seen multiple times in the emergency room and has even undergone CT of the abdomen and  pelvis.  On one occasion it seems like he was prescribed Bentyl and he says that this does relieve the pain.  Told that it sounds like he does have IBS however given the persistent nature of this and the nocturnal awakening it probably would be prudent that we approach gastroenterology and discuss with them whether or not there needs to be any further evaluation of the concern.  I have refilled his Bentyl 10 mg to use as needed and given him information on IBS and the dietary  approach as well. - dicyclomine (BENTYL) 10 MG capsule; Take 1 capsule (10 mg total) by mouth 4 (four) times daily -  before meals and at bedtime.  Dispense: 60 capsule; Refill: 1 - Ambulatory referral to Gastroenterology

## 2020-05-01 NOTE — Patient Instructions (Signed)
Irritable Bowel Syndrome, Adult  Irritable bowel syndrome (IBS) is a group of symptoms that affects the organs responsible for digestion (gastrointestinal or GI tract). IBS is not one specific disease. To regulate how the GI tract works, the body sends signals back and forth between the intestines and the brain. If you have IBS, there may be a problem with these signals. As a result, the GI tract does not function normally. The intestines may become more sensitive and overreact to certain things. This may be especially true when you eat certain foods or when you are under stress. There are four types of IBS. These may be determined based on the consistency of your stool (feces):  IBS with diarrhea.  IBS with constipation.  Mixed IBS.  Unsubtyped IBS. It is important to know which type of IBS you have. Certain treatments are more likely to be helpful for certain types of IBS. What are the causes? The exact cause of IBS is not known. What increases the risk? You may have a higher risk for IBS if you:  Are male.  Are younger than 40.  Have a family history of IBS.  Have a mental health condition, such as depression, anxiety, or post-traumatic stress disorder.  Have had a bacterial infection of your GI tract. What are the signs or symptoms? Symptoms of IBS vary from person to person. The main symptom is abdominal pain or discomfort. Other symptoms usually include one or more of the following:  Diarrhea, constipation, or both.  Abdominal swelling or bloating.  Feeling full after eating a small or regular-sized meal.  Frequent gas.  Mucus in the stool.  A feeling of having more stool left after a bowel movement. Symptoms tend to come and go. They may be triggered by stress, mental health conditions, or certain foods. How is this diagnosed? This condition may be diagnosed based on a physical exam, your medical history, and your symptoms. You may have tests, such as:  Blood  tests.  Stool test.  X-rays.  CT scan.  Colonoscopy. This is a procedure in which your GI tract is viewed with a long, thin, flexible tube. How is this treated? There is no cure for IBS, but treatment can help relieve symptoms. Treatment depends on the type of IBS you have, and may include:  Changes to your diet, such as: ? Avoiding foods that cause symptoms. ? Drinking more water. ? Following a low-FODMAP (fermentable oligosaccharides, disaccharides, monosaccharides, and polyols) diet for up to 6 weeks, or as told by your health care provider. FODMAPs are sugars that are hard for some people to digest. ? Eating more fiber. ? Eating medium-sized meals at the same times every day.  Medicines. These may include: ? Fiber supplements, if you have constipation. ? Medicine to control diarrhea (antidiarrheal medicines). ? Medicine to help control muscle tightening (spasms) in your GI tract (antispasmodic medicines). ? Medicines to help with mental health conditions, such as antidepressants or tranquilizers.  Talk therapy or counseling.  Working with a diet and nutrition specialist (dietitian) to help create a food plan that is right for you.  Managing your stress. Follow these instructions at home: Eating and drinking  Eat a healthy diet.  Eat medium-sized meals at about the same time every day. Do not eat large meals.  Gradually eat more fiber-rich foods. These include whole grains, fruits, and vegetables. This may be especially helpful if you have IBS with constipation.  Eat a diet low in FODMAPs.  Drink enough   fluid to keep your urine pale yellow.  Keep a journal of foods that seem to trigger symptoms.  Avoid foods and drinks that: ? Contain added sugar. ? Make your symptoms worse. Dairy products, caffeinated drinks, and carbonated drinks can make symptoms worse for some people. General instructions  Take over-the-counter and prescription medicines and supplements only  as told by your health care provider.  Get enough exercise. Do at least 150 minutes of moderate-intensity exercise each week.  Manage your stress. Getting enough sleep and exercise can help you manage stress.  Keep all follow-up visits as told by your health care provider and therapist. This is important. Alcohol Use  Do not drink alcohol if: ? Your health care provider tells you not to drink. ? You are pregnant, may be pregnant, or are planning to become pregnant.  If you drink alcohol, limit how much you have: ? 0-1 drink a day for women. ? 0-2 drinks a day for men.  Be aware of how much alcohol is in your drink. In the [place] one drink equals one typical bottle of beer (12 oz), one-half glass of wine (5 oz), or one shot of hard liquor (1 oz). Contact a health care provider if you have:  Constant pain.  Weight loss.  Difficulty or pain when swallowing.  Diarrhea that gets worse. Get help right away if you have:  Severe abdominal pain.  Fever.  Diarrhea with symptoms of dehydration, such as dizziness or dry mouth.  Bright red blood in your stool.  Stool that is black and tarry.  Abdominal swelling.  Vomiting that does not stop.  Blood in your vomit. Summary  Irritable bowel syndrome (IBS) is not one specific disease. It is a group of symptoms that affects digestion.  Your intestines may become more sensitive and overreact to certain things. This may be especially true when you eat certain foods or when you are under stress.  There is no cure for IBS, but treatment can help relieve symptoms. This information is not intended to replace advice given to you by your health care provider. Make sure you discuss any questions you have with your health care provider. Document Revised: 11/14/2017 Document Reviewed: 11/14/2017 Elsevier Patient Education  2020 [employer] Diet for Irritable Bowel Syndrome When you have irritable bowel syndrome (IBS), it is very  important to eat the foods and follow the eating habits that are best for your condition. IBS may cause various symptoms such as pain in the abdomen, constipation, or diarrhea. Choosing the right foods can help to ease the discomfort from these symptoms. Work with your health care provider and diet and nutrition specialist (dietitian) to find the eating plan that will help to control your symptoms. What are tips for following this plan?      Keep a food diary. This will help you identify foods that cause symptoms. Write down: ? What you eat and when you eat it. ? What symptoms you have. ? When symptoms occur in relation to your meals, such as "pain in abdomen 2 hours after dinner."  Eat your meals slowly and in a relaxed setting.  Aim to eat 5-6 small meals per day. Do not skip meals.  Drink enough fluid to keep your urine pale yellow.  Ask your health care provider if you should take an over-the-counter probiotic to help restore healthy bacteria in your gut (digestive tract). ? Probiotics are foods that contain good bacteria and yeasts.  Your dietitian may have specific dietary   recommendations for you based on your symptoms. He or she may recommend that you: ? Avoid foods that cause symptoms. Talk with your dietitian about other ways to get the same nutrients that are in those problem foods. ? Avoid foods with gluten. Gluten is a protein that is found in rye, wheat, and barley. ? Eat more foods that contain soluble fiber. Examples of foods with high soluble fiber include oats, seeds, and certain fruits and vegetables. Take a fiber supplement if directed by your dietitian. ? Reduce or avoid certain foods called FODMAPs. These are foods that contain carbohydrates that are hard to digest. Ask your doctor which foods contain these carbohydrates. What foods are not recommended? The following are some foods and drinks that may make your symptoms worse:  Fatty foods, such as french fries.   Foods that contain gluten, such as pasta and cereal.  Dairy products, such as milk, cheese, and ice cream.  Chocolate.  Alcohol.  Products with caffeine, such as coffee.  Carbonated drinks, such as soda.  Foods that are high in FODMAPs. These include certain fruits and vegetables.  Products with sweeteners such as honey, high fructose corn syrup, sorbitol, and mannitol. The items listed above may not be a complete list of foods and beverages you should avoid. Contact a dietitian for more information. What foods are good sources of fiber? Your health care provider or dietitian may recommend that you eat more foods that contain fiber. Fiber can help to reduce constipation and other IBS symptoms. Add foods with fiber to your diet a little at a time so your body can get used to them. Too much fiber at one time might cause gas and swelling of your abdomen. The following are some foods that are good sources of fiber:  Berries, such as raspberries, strawberries, and blueberries.  Tomatoes.  Carrots.  Brown rice.  Oats.  Seeds, such as chia and pumpkin seeds. The items listed above may not be a complete list of recommended sources of fiber. Contact your dietitian for more options. Where to find more information  International Foundation for Functional Gastrointestinal Disorders: www.iffgd.org  National Institute of Diabetes and Digestive and Kidney Diseases: www.niddk.nih.gov Summary  When you have irritable bowel syndrome (IBS), it is very important to eat the foods and follow the eating habits that are best for your condition.  IBS may cause various symptoms such as pain in the abdomen, constipation, or diarrhea.  Choosing the right foods can help to ease the discomfort that comes from symptoms.  Keep a food diary. This will help you identify foods that cause symptoms.  Your health care provider or diet and nutrition specialist (dietitian) may recommend that you eat more foods  that contain fiber. This information is not intended to replace advice given to you by your health care provider. Make sure you discuss any questions you have with your health care provider. Document Revised: 03/13/2019 Document Reviewed: 07/25/2017 Elsevier Patient Education  2020 [employer]  

## 2020-05-14 ENCOUNTER — Encounter: Payer: Self-pay | Admitting: *Deleted

## 2020-08-18 ENCOUNTER — Ambulatory Visit (INDEPENDENT_AMBULATORY_CARE_PROVIDER_SITE_OTHER): Payer: Medicaid Other | Admitting: Gastroenterology

## 2020-08-18 ENCOUNTER — Other Ambulatory Visit: Payer: Self-pay

## 2020-08-18 ENCOUNTER — Other Ambulatory Visit: Payer: Self-pay | Admitting: Gastroenterology

## 2020-08-18 ENCOUNTER — Encounter: Payer: Self-pay | Admitting: Gastroenterology

## 2020-08-18 VITALS — BP 135/77 | HR 73 | Temp 97.4°F | Ht 70.0 in | Wt 215.0 lb

## 2020-08-18 DIAGNOSIS — R197 Diarrhea, unspecified: Secondary | ICD-10-CM

## 2020-08-18 DIAGNOSIS — R1012 Left upper quadrant pain: Secondary | ICD-10-CM | POA: Diagnosis not present

## 2020-08-18 MED ORDER — OMEPRAZOLE 40 MG PO CPDR: 40.0000 mg | DELAYED_RELEASE_CAPSULE | Freq: Every day | ORAL | 0 refills | Status: AC

## 2020-08-18 NOTE — Progress Notes (Signed)
Kenneth Lucas [address]  [address]  [place], [place] [address]  Main: [phone]  Fax: [phone]   Gastroenterology Consultation  Referring Provider:     Duanne Limerick, MD Primary Care Physician:  Duanne Limerick, MD Reason for Consultation:   Abdominal pain        HPI:    Chief Complaint  Patient presents with  . LUQ Abdominal pain    Kenneth Lucas is a 19 y.o. y/o male referred for consultation & management  by Dr. Duanne Limerick, MD.  Patient reports 2-year history of abdominal pain, that occurs daily in the morning.  Patient wakes up with the pain in the mornings and is present before breakfast.  Relaxing takes away the pain.  Being in a hot environment makes it worse.  Does have intermittent nausea and vomiting with that, but that is not a frequent occurrence, the pain is the main complaint.  Left upper quadrant, dull, nonradiating.  Does report frequent heartburn as well.  Has taken occasional over-the-counter reflux medications that have helped but has never been on them daily.  Has had previous ER visits for this pain with CT scan in March 2020 being reassuring.  Most most recent labs from May 2021 in Care Everywhere show normal liver enzymes and CBC.  Patient does smoke marijuana, but again does not have frequent vomiting.  No prior EGD.  No dysphagia.  No family history of colon cancer.  Does also report 4-5 bowel movements a day which is chronic.  Has never had any work-up for this.  No blood in stool.  No family history of colon cancer.  No prior colonoscopy.  Does appear he had a GI infectious panel that was negative in 2017.  Dairy products do not worsen symptoms and actually help make his bowel movements less frequent and more formed.  Bowel movement consistency varies between loose and formed.  Denies any recent stressors.  Does report previous history of anxiety, but states this has not bothered him recently.  Bentyl as prescribed by PCP and  states this does help.  Ibuprofen as needed was listed on his medication list but he states he does not use it.  Past Medical History:  Diagnosis Date  . Allergic rhinitis   . Allergy   . Heat rash   . Kawasaki disease [facility])     Past Surgical History:  Procedure Laterality Date  . TESTICLE SURGERY    . TONSILLECTOMY      Prior to Admission medications   Medication Sig Start Date End Date Taking? Authorizing Provider  dicyclomine (BENTYL) 10 MG capsule Take 1 capsule (10 mg total) by mouth 4 (four) times daily -  before meals and at bedtime. 05/01/20  Yes Duanne Limerick, MD  ibuprofen (ADVIL,MOTRIN) 600 MG tablet Take 1 tablet (600 mg total) by mouth every 6 (six) hours as needed. 02/17/19  Yes Domenick Gong, MD  omeprazole (PRILOSEC) 40 MG capsule Take 1 capsule (40 mg total) by mouth daily. 08/18/20 09/17/20  Pasty Spillers, MD    Family History  Problem Relation Age of Onset  . Diabetes Father   . Heart disease Maternal Grandmother   . Hypertension Maternal Grandmother   . Heart disease Maternal Grandfather   . Hypertension Maternal Grandfather      Social History   Tobacco Use  . Smoking status: Former Smoker    Types: E-cigarettes    Quit date: 01/06/2019    Years  since quitting: 1.6  . Smokeless tobacco: Never Used  Vaping Use  . Vaping Use: Never used  Substance Use Topics  . Alcohol use: Yes    Comment: minimal  . Drug use: Yes    Types: Marijuana    Allergies as of 08/18/2020  . (No Known Allergies)    Review of Systems:    All systems reviewed and negative except where noted in HPI.   Physical Exam:  BP 135/77   Pulse 73   Temp (!) 97.4 F (36.3 C) (Oral)   Ht 5\' 10"  (1.778 m)   Wt 215 lb (97.5 kg)   BMI 30.85 kg/m  No LMP for male patient. Psych:  Alert and cooperative. Normal mood and affect. General:   Alert,  Well-developed, well-nourished, pleasant and cooperative in NAD Head:  Normocephalic and atraumatic. Eyes:  Sclera  clear, no icterus.   Conjunctiva pink. Ears:  Normal auditory acuity. Nose:  No deformity, discharge, or lesions. Mouth:  No deformity or lesions,oropharynx pink & moist. Neck:  Supple; no masses or thyromegaly. Abdomen:  Normal bowel sounds.  No bruits.  Soft, non-tender and non-distended without masses, hepatosplenomegaly or hernias noted.  No guarding or rebound tenderness.    Msk:  Symmetrical without gross deformities. Good, equal movement & strength bilaterally. Pulses:  Normal pulses noted. Extremities:  No clubbing or edema.  No cyanosis. Neurologic:  Alert and oriented x3;  grossly normal neurologically. Skin:  Intact without significant lesions or rashes. No jaundice. Lymph Nodes:  No significant cervical adenopathy. Psych:  Alert and cooperative. Normal mood and affect.   Labs: CBC    Component Value Date/Time   WBC 8.1 01/14/2020 1441   WBC 6.1 02/17/2019 0918   RBC 5.56 01/14/2020 1441   RBC 5.40 02/17/2019 0918   HGB 15.2 01/14/2020 1441   HCT 44.8 01/14/2020 1441   PLT 257 01/14/2020 1441   MCV 81 01/14/2020 1441   MCH 27.3 01/14/2020 1441   MCH 28.0 02/17/2019 0918   MCHC 33.9 01/14/2020 1441   MCHC 34.2 02/17/2019 0918   RDW 13.0 01/14/2020 1441   LYMPHSABS 2.9 01/14/2020 1441   MONOABS 0.5 02/17/2019 0918   EOSABS 0.2 01/14/2020 1441   BASOSABS 0.1 01/14/2020 1441   CMP     Component Value Date/Time   NA 140 01/14/2020 1441   K 4.4 01/14/2020 1441   CL 101 01/14/2020 1441   CO2 21 01/14/2020 1441   GLUCOSE 92 01/14/2020 1441   GLUCOSE 101 (H) 02/17/2019 0918   BUN 15 01/14/2020 1441   CREATININE 0.84 01/14/2020 1441   CALCIUM 9.9 01/14/2020 1441   PROT 7.9 02/17/2019 0918   ALBUMIN 5.1 01/14/2020 1441   AST 22 02/17/2019 0918   ALT 25 02/17/2019 0918   ALKPHOS 61 02/17/2019 0918   BILITOT 0.5 02/17/2019 0918   GFRNONAA 127 01/14/2020 1441   GFRAA 147 01/14/2020 1441    Imaging Studies: No results found.  Assessment and Plan:   Kenneth Lucas is a 19 y.o. y/o male has been referred for abdominal pain  Labs are otherwise reassuring We will obtain H. pylori breath test, patient has never had this done before  Symptoms may be related to GERD versus dyspepsia.  PPI can help patient can start taking it after getting his H. pylori testing done..  Short-term therapy for 1 month only.  (Risks of PPI use were discussed with patient including bone loss, C. Diff diarrhea, pneumonia, infections, CKD, electrolyte abnormalities.  Pt. Verbalizes understanding and chooses to continue the medication.)  Due to his loose bowel movements that are chronic, with no prior work-up for the same, will obtain fecal calprotectin and celiac panel as well.  We will also check CRP.  Option of proceeding with upper endoscopy given abdominal pain discussed as well, but patient prefers above work-up first  Avoid NSAID use such as Ibuprofen, Aleeve, advil, motrin, BC and Goodie powder, Naproxen, Meloxicam and others.    Dr Kenneth Lucas  Speech recognition software was used to dictate the above note.

## 2020-08-19 LAB — CELIAC DISEASE AB SCREEN W/RFX
Antigliadin Abs, IgA: 3 units (ref 0–19)
IgA/Immunoglobulin A, Serum: 196 mg/dL (ref 90–386)
Transglutaminase IgA: <2 U/mL (ref 0–3)

## 2020-08-19 LAB — C-REACTIVE PROTEIN: CRP: <1 mg/L (ref 0–10)

## 2020-08-20 LAB — H. PYLORI BREATH TEST: H pylori Breath Test: NEGATIVE

## 2020-08-26 ENCOUNTER — Telehealth: Payer: Self-pay

## 2020-08-26 NOTE — Telephone Encounter (Signed)
-----   Message from Pasty Spillers, MD sent at 08/26/2020  1:00 PM EDT ----- Kandis Cocking please let the patient know, his blood work has been reassuring.  Take the medication as prescribed.  We are awaiting stool test.

## 2020-08-26 NOTE — Telephone Encounter (Signed)
Called patient to let him know about his results. I also asked him if he had returned his stool sample and he stated that he had not had the time. However, he stated that he will try to turn it in soon. Dr. Maximino Greenland will be notified.

## 2020-09-02 ENCOUNTER — Other Ambulatory Visit: Payer: Self-pay | Admitting: Gastroenterology

## 2020-09-02 DIAGNOSIS — R1012 Left upper quadrant pain: Secondary | ICD-10-CM | POA: Diagnosis not present

## 2020-09-02 DIAGNOSIS — R197 Diarrhea, unspecified: Secondary | ICD-10-CM | POA: Diagnosis not present

## 2020-09-03 LAB — CALPROTECTIN, FECAL: Calprotectin, Fecal: <16 ug/g (ref 0–120)

## 2020-09-21 ENCOUNTER — Other Ambulatory Visit: Payer: Self-pay | Admitting: Family Medicine

## 2020-09-21 DIAGNOSIS — R1012 Left upper quadrant pain: Secondary | ICD-10-CM

## 2020-09-21 NOTE — Telephone Encounter (Signed)
Requested medication (s) are due for refill today:  Yes  Requested medication (s) are on the active medication list:  Yes  Future visit scheduled:  No  Last Refill: 05/01/20; # 60/ RF x 1  Notes to clinic:  pt. Was referred to GI.  Had initial appt. 08/28/20.  Dicyclomine was not address in GI eval.  Please advise on refill.    Requested Prescriptions  Pending Prescriptions Disp Refills   dicyclomine (BENTYL) 10 MG capsule [Pharmacy Med Name: DICYCLOMINE HCL 10 MG CAP] 60 capsule 1    Sig: TAKE 1 CAPSULE BY MOUTH 4 TIMES DAILY - BEFORE MEALS AND AT BEDTIME.      Gastroenterology:  Antispasmodic Agents Passed - 09/21/2020  2:35 PM      Passed - Last Heart Rate in normal range    Pulse Readings from Last 1 Encounters:  08/18/20 73          Passed - Valid encounter within last 12 months    Recent Outpatient Visits           4 months ago Left upper quadrant abdominal pain   [facility] Duanne Limerick, MD   8 months ago Postural dizziness with near syncope   [facility] Duanne Limerick, MD   1 year ago Acute pansinusitis, recurrence not specified   [facility] Duanne Limerick, MD   1 year ago Annual physical exam   [facility] Duanne Limerick, MD   1 year ago Establishing care with new doctor, encounter for   [facility] Duanne Limerick, MD       Future Appointments             In 1 week Pasty Spillers, MD [facility]

## 2020-09-29 ENCOUNTER — Encounter: Payer: Self-pay | Admitting: Gastroenterology

## 2020-09-29 ENCOUNTER — Ambulatory Visit (INDEPENDENT_AMBULATORY_CARE_PROVIDER_SITE_OTHER): Payer: Medicaid Other | Admitting: Gastroenterology

## 2020-09-29 ENCOUNTER — Other Ambulatory Visit: Payer: Self-pay

## 2020-09-29 DIAGNOSIS — Z5329 Procedure and treatment not carried out because of patient's decision for other reasons: Secondary | ICD-10-CM

## 2020-11-02 ENCOUNTER — Other Ambulatory Visit: Payer: Self-pay

## 2020-11-02 ENCOUNTER — Ambulatory Visit
Admission: EM | Admit: 2020-11-02 | Discharge: 2020-11-02 | Disposition: A | Discharge status: Home / Self Care | Payer: Medicaid Other | Attending: Emergency Medicine | Admitting: Emergency Medicine

## 2020-11-02 DIAGNOSIS — S0101XA Laceration without foreign body of scalp, initial encounter: Secondary | ICD-10-CM

## 2020-11-02 MED ORDER — CEPHALEXIN 500 MG PO CAPS: 1000.0000 mg | ORAL_CAPSULE | Freq: Two times a day (BID) | ORAL | 0 refills | Status: AC

## 2020-11-02 NOTE — ED Triage Notes (Signed)
Patient states that around 2pm today he was pulling down the latch to his box truck and it slammed in to his head. Patient with large laceration to top of scalp.

## 2020-11-02 NOTE — ED Provider Notes (Signed)
HPI  SUBJECTIVE:  Kenneth Lucas is a 19 y.o. male who presents with a laceration to his scalp sustained when he hit his head on a dirty trunk latch.  He states it bled for about 30 minutes and then stopped.  He states that he was able to finish working, go home, and wash it out with shampoo and water.  His wife advised him to come in for evaluation.  He denies pain currently.  No headache, nausea, vomiting, loss consciousness.  No aggravating or alleviating factors.  His tetanus is up-to-date.  He has no past medical history.  OFB:PZWCH, Vanita Panda, MD    Past Medical History:  Diagnosis Date  . Allergic rhinitis   . Allergy   . Heat rash   . Kawasaki disease [facility])     Past Surgical History:  Procedure Laterality Date  . TESTICLE SURGERY    . TONSILLECTOMY      Family History  Problem Relation Age of Onset  . Diabetes Father   . Heart disease Maternal Grandmother   . Hypertension Maternal Grandmother   . Heart disease Maternal Grandfather   . Hypertension Maternal Grandfather     Social History   Tobacco Use  . Smoking status: Former Smoker    Types: E-cigarettes    Quit date: 01/06/2019    Years since quitting: 1.8  . Smokeless tobacco: Never Used  Vaping Use  . Vaping Use: Never used  Substance Use Topics  . Alcohol use: Yes    Comment: minimal  . Drug use: Yes    Types: Marijuana    No current facility-administered medications for this encounter.  Current Outpatient Medications:  .  dicyclomine (BENTYL) 10 MG capsule, TAKE 1 CAPSULE BY MOUTH 4 TIMES DAILY - BEFORE MEALS AND AT BEDTIME., Disp: 60 capsule, Rfl: 1 .  omeprazole (PRILOSEC) 40 MG capsule, Take 1 capsule (40 mg total) by mouth daily., Disp: 30 capsule, Rfl: 0 .  cephALEXin (KEFLEX) 500 MG capsule, Take 2 capsules (1,000 mg total) by mouth 2 (two) times daily for 5 days., Disp: 20 capsule, Rfl: 0  No Known Allergies   ROS  As noted in HPI.   Physical Exam  BP (!) 145/82 (BP Location: Left  Arm)   Pulse 81   Temp 98.1 F (36.7 C) (Oral)   Resp 18   Ht 5\' 11"  (1.803 m)   Wt 97.5 kg   SpO2 100%   BMI 29.99 kg/m   Constitutional: Well developed, well nourished, no acute distress Eyes:  EOMI, conjunctiva normal bilaterally HENT: 5 centimeter linear laceration on the scalp.  No visible bone.  No skull crepitus.  No apparent contamination, retained foreign body.     Respiratory: Normal inspiratory effort Cardiovascular: Normal rate GI: nondistended skin: No rash, skin intact Musculoskeletal: no deformities Neurologic: Alert & oriented x 3, no focal neuro deficits Psychiatric: Speech and behavior appropriate   ED Course   Medications - No data to display  No orders of the defined types were placed in this encounter.   No results found for this or any previous visit (from the past 24 hour(s)). No results found.  ED Clinical Impression  1. Laceration of scalp, initial encounter      ED Assessment/Plan  Patient with a scalp laceration.  No evidence of skull fracture.  Procedure note: Cleaned with chlorhexidine alcohol.  We irrigated out with wound care solution.  Anesthetized area via local infiltration with 4.5 mL of 1% lidocaine with epinephrine.  Placed 6 staples with close approximation of wound edges.  Patient tolerated procedure well.  Will send home with Keflex because this was sustained from a contaminated surface, however it was irrigated thoroughly out by patient and myself.  Bacitracin for the first 48 hours.  Return here in 10 days for staple removal, sooner for any signs of infection.   Meds ordered this encounter  Medications  . cephALEXin (KEFLEX) 500 MG capsule    Sig: Take 2 capsules (1,000 mg total) by mouth 2 (two) times daily for 5 days.    Dispense:  20 capsule    Refill:  0    *This clinic note was created using Scientist, clinical (histocompatibility and immunogenetics). Therefore, there may be occasional mistakes despite careful proofreading.   ?     Domenick Gong, MD 11/03/20 [phone]

## 2020-11-02 NOTE — Discharge Instructions (Addendum)
Keep this clean and dry for the first 24 to 48 hours.  You can then wash gently with soap and water.  We will see you here in 10 days for suture removal.  I have decided to put you on 5 days of prophylactic antibiotics since this was a contaminated wound even though you cleaned it out very well.  Return sooner for any signs of infection.

## 2020-11-12 ENCOUNTER — Other Ambulatory Visit: Payer: Self-pay

## 2020-11-12 ENCOUNTER — Ambulatory Visit
Admission: EM | Admit: 2020-11-12 | Discharge: 2020-11-12 | Disposition: A | Discharge status: Home / Self Care | Payer: Medicaid Other | Attending: Sports Medicine | Admitting: Sports Medicine

## 2020-11-12 DIAGNOSIS — Z4802 Encounter for removal of sutures: Secondary | ICD-10-CM | POA: Diagnosis not present

## 2020-11-12 NOTE — ED Triage Notes (Signed)
Pt here to have staples removed. He had 6 staples placed in head on 11/29. No other concerns at this time.

## 2020-11-12 NOTE — ED Notes (Signed)
Pt became diaphoretic s/p having staples removed. Pt was asked to wait here for before being discharged.

## 2020-12-29 DIAGNOSIS — Z20822 Contact with and (suspected) exposure to covid-19: Secondary | ICD-10-CM | POA: Diagnosis not present

## 2021-01-05 DIAGNOSIS — Z20822 Contact with and (suspected) exposure to covid-19: Secondary | ICD-10-CM | POA: Diagnosis not present

## 2021-01-12 DIAGNOSIS — Z20822 Contact with and (suspected) exposure to covid-19: Secondary | ICD-10-CM | POA: Diagnosis not present

## 2021-01-22 ENCOUNTER — Other Ambulatory Visit: Payer: Self-pay | Admitting: Family Medicine

## 2021-01-22 DIAGNOSIS — R1012 Left upper quadrant pain: Secondary | ICD-10-CM

## 2021-02-28 DIAGNOSIS — R1084 Generalized abdominal pain: Secondary | ICD-10-CM | POA: Diagnosis not present

## 2021-02-28 DIAGNOSIS — Z20822 Contact with and (suspected) exposure to covid-19: Secondary | ICD-10-CM | POA: Diagnosis not present

## 2021-02-28 DIAGNOSIS — I498 Other specified cardiac arrhythmias: Secondary | ICD-10-CM | POA: Diagnosis not present

## 2021-02-28 DIAGNOSIS — A059 Bacterial foodborne intoxication, unspecified: Secondary | ICD-10-CM | POA: Diagnosis not present

## 2021-02-28 DIAGNOSIS — R10817 Generalized abdominal tenderness: Secondary | ICD-10-CM | POA: Diagnosis not present

## 2021-02-28 DIAGNOSIS — R1012 Left upper quadrant pain: Secondary | ICD-10-CM | POA: Diagnosis not present

## 2021-02-28 DIAGNOSIS — R197 Diarrhea, unspecified: Secondary | ICD-10-CM | POA: Diagnosis not present

## 2021-02-28 DIAGNOSIS — R112 Nausea with vomiting, unspecified: Secondary | ICD-10-CM | POA: Diagnosis not present

## 2021-03-01 ENCOUNTER — Telehealth: Payer: Self-pay | Admitting: *Deleted

## 2021-03-01 NOTE — Telephone Encounter (Signed)
Transition Care Management Follow-up Telephone Call  Date of discharge and from where: 02/28/2021 [facility] ED  How have you been since you were released from the hospital? "A little better but still feel weak"  Any questions or concerns? No  Items Reviewed:  Did the pt receive and understand the discharge instructions provided? Yes   Medications obtained and verified? Yes   Other? No   Any new allergies since your discharge? No   Dietary orders reviewed? No  Do you have support at home? Yes     Functional Questionnaire: (I = Independent and D = Dependent) ADLs: I  Bathing/Dressing- I  Meal Prep- I  Eating- I  Maintaining continence- I  Transferring/Ambulation- I  Managing Meds- I  Follow up appointments reviewed:   PCP Hospital f/u appt confirmed? No    Specialist Hospital f/u appt confirmed? No    Are transportation arrangements needed? No   If their condition worsens, is the pt aware to call PCP or go to the Emergency Dept.? Yes  Was the patient provided with contact information for the PCP's office or ED? Yes  Was to pt encouraged to call back with questions or concerns? Yes

## 2021-03-22 ENCOUNTER — Ambulatory Visit
Admission: EM | Admit: 2021-03-22 | Discharge: 2021-03-22 | Disposition: A | Discharge status: Home / Self Care | Payer: BC Managed Care – PPO | Attending: Family Medicine | Admitting: Family Medicine

## 2021-03-22 ENCOUNTER — Other Ambulatory Visit: Payer: Self-pay

## 2021-03-22 DIAGNOSIS — S6992XA Unspecified injury of left wrist, hand and finger(s), initial encounter: Secondary | ICD-10-CM | POA: Diagnosis not present

## 2021-03-22 NOTE — ED Triage Notes (Signed)
Pt c/o injury to his left ring finger last week. Pt got a screw stuck in his finger, he was able to remove it. The area now is red, swollen and painful. Pt is concerned about infection. Pt denies f/n/v/d or other symptoms.

## 2021-03-22 NOTE — ED Provider Notes (Signed)
[facility]    CSN: [id number] Arrival date & time: 03/22/21  1304      History   Chief Complaint Chief Complaint  Patient presents with  . Finger Injury    Left, ring   HPI  20 year old male presents with the above complaint.  Patient reports that he injured his left ring finger last week.  He accidentally screwed a screw into his finger.  He states that he has no pain.  No discharge in the wound.  No fever.  He is concerned about the possibility of infection.  He would like this examined today.  No other associated symptoms.  No other complaints.   Past Medical History:  Diagnosis Date  . Allergic rhinitis   . Allergy   . Heat rash   . Kawasaki disease [facility])     Patient Active Problem List   Diagnosis Date Noted  . Kawasaki disease (HCC) 04/04/2013    Past Surgical History:  Procedure Laterality Date  . TESTICLE SURGERY    . TONSILLECTOMY         Home Medications    Prior to Admission medications   Medication Sig Start Date End Date Taking? Authorizing Provider  dicyclomine (BENTYL) 10 MG capsule TAKE 1 CAPSULE BY MOUTH 4 TIMES DAILY - BEFORE MEALS AND AT BEDTIME. 01/22/21  Yes Duanne Limerick, MD  ondansetron (ZOFRAN-ODT) 4 MG disintegrating tablet Take 4 mg by mouth every 8 (eight) hours as needed. 03/01/21  Yes [provider]  omeprazole (PRILOSEC) 40 MG capsule Take 1 capsule (40 mg total) by mouth daily. 08/18/20 11/02/20  Pasty Spillers, MD    Family History Family History  Problem Relation Age of Onset  . Diabetes Father   . Heart disease Maternal Grandmother   . Hypertension Maternal Grandmother   . Heart disease Maternal Grandfather   . Hypertension Maternal Grandfather     Social History Social History   Tobacco Use  . Smoking status: Former Smoker    Types: E-cigarettes    Quit date: 01/06/2019    Years since quitting: 2.2  . Smokeless tobacco: Never Used  Vaping Use  . Vaping Use: Never used  Substance Use  Topics  . Alcohol use: Yes    Comment: minimal  . Drug use: Yes    Types: Marijuana     Allergies   Patient has no known allergies.   Review of Systems Review of Systems Per HPI  Physical Exam Triage Vital Signs ED Triage Vitals  Enc Vitals Group     BP 03/22/21 1317 140/81     Pulse Rate 03/22/21 1317 70     Resp 03/22/21 1317 18     Temp 03/22/21 1317 98.3 F (36.8 C)     Temp Source 03/22/21 1317 Oral     SpO2 03/22/21 1317 100 %     Weight 03/22/21 1316 220 lb (99.8 kg)     Height 03/22/21 1316 5\' 11"  (1.803 m)     Head Circumference --      Peak Flow --      Pain Score 03/22/21 1315 0     Pain Loc --      Pain Edu? --      Excl. in GC? --     Updated Vital Signs BP 140/81 (BP Location: Left Arm)   Pulse 70   Temp 98.3 F (36.8 C) (Oral)   Resp 18   Ht 5\' 11"  (1.803 m)   Wt 99.8 kg  SpO2 100%   BMI 30.68 kg/m   Visual Acuity Right Eye Distance:   Left Eye Distance:   Bilateral Distance:    Right Eye Near:   Left Eye Near:    Bilateral Near:     Physical Exam Constitutional:      General: He is not in acute distress.    Appearance: Normal appearance. He is not ill-appearing.  HENT:     Head: Normocephalic and atraumatic.  Skin:    Comments: Left ring finger -small eschar noted from recent puncture.  No significant surrounding erythema.  No drainage.  No tenderness to palpation.  Neurological:     Mental Status: He is alert.  Psychiatric:        Mood and Affect: Mood normal.        Behavior: Behavior normal.    UC Treatments / Results  Labs (all labs ordered are listed, but only abnormal results are displayed) Labs Reviewed - No data to display  EKG   Radiology No results found.  Procedures Procedures (including critical care time)  Medications Ordered in UC Medications - No data to display  Initial Impression / Assessment and Plan / UC Course  I have reviewed the triage vital signs and the nursing notes.  Pertinent labs  & imaging results that were available during my care of the patient were reviewed by me and considered in my medical decision making (see chart for details).    20 year old male presents with a recent injury to his left ring finger.  Patient is concerned about possible infection.  His exam is benign.  I advised supportive care and close monitoring.  Advised to keep the wound clean.  Final Clinical Impressions(s) / UC Diagnoses   Final diagnoses:  Injury of finger of left hand, initial encounter   Discharge Instructions   None    ED Prescriptions    None     PDMP not reviewed this encounter.   Tommie Sams, [place] 03/22/21 1328

## 2021-05-12 ENCOUNTER — Other Ambulatory Visit: Payer: Self-pay | Admitting: Family Medicine

## 2021-05-12 ENCOUNTER — Other Ambulatory Visit: Payer: Self-pay | Admitting: Gastroenterology

## 2021-05-12 DIAGNOSIS — R1012 Left upper quadrant pain: Secondary | ICD-10-CM

## 2021-05-12 NOTE — Telephone Encounter (Signed)
Requested medication (s) are due for refill today:   Provider to review  Requested medication (s) are on the active medication list:   Yes  Future visit scheduled:   No   Last ordered: 03/22/2021 from [facility] visit.  Returned because prescribed by a historical provider at [facility]gent [facility] with [facility].   Requested Prescriptions  Pending Prescriptions Disp Refills   ondansetron (ZOFRAN-ODT) 4 MG disintegrating tablet [Pharmacy Med Name: ONDANSETRON 4 MG ODT] 10 tablet     Sig: TAKE (1) TABLET BY MOUTH EVERY 8 HOURS AS NEEDED FOR NAUSEA      Not Delegated - Gastroenterology: Antiemetics Failed - 05/12/2021  9:11 AM      Failed - This refill cannot be delegated      Failed - Valid encounter within last 6 months    Recent Outpatient Visits           1 year ago Left upper quadrant abdominal pain   [facility] Duanne Limerick, MD   1 year ago Postural dizziness with near syncope   [facility] Duanne Limerick, MD   2 years ago Acute pansinusitis, recurrence not specified   [facility] Duanne Limerick, MD   2 years ago Annual physical exam   [facility] Duanne Limerick, MD   2 years ago Establishing care with new doctor, encounter for   [facility] Duanne Limerick, MD

## 2021-05-12 NOTE — Telephone Encounter (Signed)
Requested medication (s) are due for refill today:no  Requested medication (s) are on the active medication list: yes   Last refill:  03/04/2021  Future visit scheduled:no  Last office visit was 05/01/2020 Due for follow up appt   Requested Prescriptions  Pending Prescriptions Disp Refills   dicyclomine (BENTYL) 10 MG capsule [Pharmacy Med Name: DICYCLOMINE HCL 10 MG CAP] 60 capsule 1    Sig: TAKE 1 CAPSULE BY MOUTH 4 TIMES DAILY - BEFORE MEALS AND AT BEDTIME.      Gastroenterology:  Antispasmodic Agents Failed - 05/12/2021  9:09 AM      Failed - Valid encounter within last 12 months    Recent Outpatient Visits           1 year ago Left upper quadrant abdominal pain   [facility] Duanne Limerick, MD   1 year ago Postural dizziness with near syncope   [facility] Duanne Limerick, MD   2 years ago Acute pansinusitis, recurrence not specified   [facility] Duanne Limerick, MD   2 years ago Annual physical exam   [facility] Duanne Limerick, MD   2 years ago Establishing care with new doctor, encounter for   [facility] Duanne Limerick, MD                Passed - Last Heart Rate in normal range    Pulse Readings from Last 1 Encounters:  03/22/21 70

## 2021-07-15 ENCOUNTER — Other Ambulatory Visit: Payer: Self-pay | Admitting: Gastroenterology

## 2021-07-15 ENCOUNTER — Other Ambulatory Visit: Payer: Self-pay | Admitting: Family Medicine

## 2021-07-15 DIAGNOSIS — R1012 Left upper quadrant pain: Secondary | ICD-10-CM

## 2021-07-15 NOTE — Telephone Encounter (Signed)
Requested medication (s) are due for refill today - unsure  Requested medication (s) are on the active medication list -yes  Future visit scheduled -no  Last refill: 01/22/21 #60 1 RF  Notes to clinic: Duplicate request for medication declined by outside provider. Fails appointment protocol for refill- attempted to call patient for appointment- left message to call office for appointment  Requested Prescriptions  Pending Prescriptions Disp Refills   dicyclomine (BENTYL) 10 MG capsule [Pharmacy Med Name: DICYCLOMINE HCL 10 MG CAP] 60 capsule 1    Sig: TAKE 1 CAPSULE BY MOUTH 4 TIMES DAILY - BEFORE MEALS AND AT BEDTIME.     Gastroenterology:  Antispasmodic Agents Failed - 07/15/2021 11:18 AM      Failed - Valid encounter within last 12 months    Recent Outpatient Visits           1 year ago Left upper quadrant abdominal pain   [facility] Duanne Limerick, MD   1 year ago Postural dizziness with near syncope   [facility] Duanne Limerick, MD   2 years ago Acute pansinusitis, recurrence not specified   [facility] Duanne Limerick, MD   2 years ago Annual physical exam   [facility] Duanne Limerick, MD   2 years ago Establishing care with new doctor, encounter for   [facility] Duanne Limerick, MD              Passed - Last Heart Rate in normal range    Pulse Readings from Last 1 Encounters:  03/22/21 70             Requested Prescriptions  Pending Prescriptions Disp Refills   dicyclomine (BENTYL) 10 MG capsule [Pharmacy Med Name: DICYCLOMINE HCL 10 MG CAP] 60 capsule 1    Sig: TAKE 1 CAPSULE BY MOUTH 4 TIMES DAILY - BEFORE MEALS AND AT BEDTIME.     Gastroenterology:  Antispasmodic Agents Failed - 07/15/2021 11:18 AM      Failed - Valid encounter within last 12 months    Recent Outpatient Visits           1 year ago Left upper quadrant abdominal pain   [facility] Duanne Limerick, MD   1 year ago Postural  dizziness with near syncope   [facility] Duanne Limerick, MD   2 years ago Acute pansinusitis, recurrence not specified   [facility] Duanne Limerick, MD   2 years ago Annual physical exam   [facility] Duanne Limerick, MD   2 years ago Establishing care with new doctor, encounter for   [facility] Duanne Limerick, MD              Passed - Last Heart Rate in normal range    Pulse Readings from Last 1 Encounters:  03/22/21 70

## 2021-07-19 NOTE — Telephone Encounter (Signed)
Pt no showed last OV needs appointment

## 2023-03-06 ENCOUNTER — Ambulatory Visit
Admission: EM | Admit: 2023-03-06 | Discharge: 2023-03-06 | Disposition: A | Discharge status: Home / Self Care | Payer: Medicaid Other | Attending: Emergency Medicine | Admitting: Emergency Medicine

## 2023-03-06 ENCOUNTER — Encounter: Payer: Self-pay | Admitting: Emergency Medicine

## 2023-03-06 DIAGNOSIS — L03317 Cellulitis of buttock: Secondary | ICD-10-CM

## 2023-03-06 MED ORDER — DOXYCYCLINE HYCLATE 100 MG PO CAPS: 100.0000 mg | ORAL_CAPSULE | Freq: Two times a day (BID) | ORAL | 0 refills | Status: AC

## 2023-03-06 NOTE — ED Provider Notes (Signed)
[facility]    CSN: [id number] Arrival date & time: 03/06/23  1152      History   Chief Complaint Chief Complaint  Patient presents with   Mass    HPI Kenneth Lucas is a 22 y.o. male.   HPI  22 year old male with past medical history significant for allergic rhinitis, Kawasaki's disease, tonsillectomy, and testicle surgery presenting for evaluation of a painful bump on his left buttock that is been there for last 3 to 4 days.  There are some mild redness but no drainage.  Past Medical History:  Diagnosis Date   Allergic rhinitis    Allergy    Heat rash    Kawasaki disease     Patient Active Problem List   Diagnosis Date Noted   Kawasaki disease 04/04/2013    Past Surgical History:  Procedure Laterality Date   TESTICLE SURGERY     TONSILLECTOMY         Home Medications    Prior to Admission medications   Medication Sig Start Date End Date Taking? Authorizing Provider  doxycycline (VIBRAMYCIN) 100 MG capsule Take 1 capsule (100 mg total) by mouth 2 (two) times daily. 03/06/23  Yes Margarette Canada, NP  dicyclomine (BENTYL) 10 MG capsule TAKE 1 CAPSULE BY MOUTH 4 TIMES DAILY - BEFORE MEALS AND AT BEDTIME. 01/22/21   Juline Patch, MD  omeprazole (PRILOSEC) 40 MG capsule Take 1 capsule (40 mg total) by mouth daily. 08/18/20 11/02/20  Virgel Manifold, MD  ondansetron (ZOFRAN-ODT) 4 MG disintegrating tablet Take 4 mg by mouth every 8 (eight) hours as needed. 03/01/21   [provider]    Family History Family History  Problem Relation Age of Onset   Diabetes Father    Heart disease Maternal Grandmother    Hypertension Maternal Grandmother    Heart disease Maternal Grandfather    Hypertension Maternal Grandfather     Social History Social History   Tobacco Use   Smoking status: Former    Types: E-cigarettes    Quit date: 01/06/2019    Years since quitting: 4.1   Smokeless tobacco: Never  Vaping Use   Vaping Use: Never used   Substance Use Topics   Alcohol use: Yes    Comment: minimal   Drug use: Yes    Types: Marijuana     Allergies   Patient has no known allergies.   Review of Systems Review of Systems  Constitutional:  Negative for fever.  Skin:  Positive for color change.     Physical Exam Triage Vital Signs ED Triage Vitals  Enc Vitals Group     BP 03/06/23 1333 (!) 140/74     Pulse Rate 03/06/23 1333 69     Resp 03/06/23 1333 18     Temp 03/06/23 1333 98 F (36.7 C)     Temp Source 03/06/23 1333 Oral     SpO2 03/06/23 1333 99 %     Weight --      Height --      Head Circumference --      Peak Flow --      Pain Score 03/06/23 1332 7     Pain Loc --      Pain Edu? --      Excl. in [place]? --    No data found.  Updated Vital Signs BP (!) 140/74 (BP Location: Left Arm)   Pulse 69   Temp 98 F (36.7 C) (Oral)   Resp 18  SpO2 99%   Visual Acuity Right Eye Distance:   Left Eye Distance:   Bilateral Distance:    Right Eye Near:   Left Eye Near:    Bilateral Near:     Physical Exam Vitals and nursing note reviewed.  Constitutional:      Appearance: Normal appearance. He is not ill-appearing.  HENT:     Head: Normocephalic and atraumatic.  Skin:    General: Skin is warm and dry.     Capillary Refill: Capillary refill takes less than 2 seconds.     Findings: Erythema present.  Neurological:     General: No focal deficit present.     Mental Status: He is alert and oriented to person, place, and time.      UC Treatments / Results  Labs (all labs ordered are listed, but only abnormal results are displayed) Labs Reviewed - No data to display  EKG   Radiology No results found.  Procedures Procedures (including critical care time)  Medications Ordered in UC Medications - No data to display  Initial Impression / Assessment and Plan / UC Course  I have reviewed the triage vital signs and the nursing notes.  Pertinent labs & imaging results that were  available during my care of the patient were reviewed by me and considered in my medical decision making (see chart for details).   A pleasant, nontoxic-appearing 74 old male here for evaluation of a painful, red, swollen area in the gluteal cleft on the superior aspect of the left buttock.  It has been there for the last 3 to 4 days and has not drained.  Patient also denies fever.  As indicated in the image above there is a half dollar sized area of erythema with induration of the tissue on the left buttock.  This clinically represents an immature abscess and cellulitis.  I will have the patient go home and apply warm compresses continually to try and get the infection to accumulate to a pocket and drain on its own.  If it does not drain on his own an I&D can be performed at a later date.  I will start him on doxycycline 100 mg twice daily for 10 days for treatment of the cellulitis.  Over-the-counter Tylenol and/or ibuprofen as needed for pain.  Return precautions reviewed.  Patient denies need for work note.  Final Clinical Impressions(s) / UC Diagnoses   Final diagnoses:  Cellulitis of buttock, left     Discharge Instructions      Take the Doxycycline twice daily with food for 10 days.  Doxycycline will make you more sensitive to sunburn so wear sunscreen when outdoors and reapply it every 90 minutes.  Apply warm compresses to help promote drainage.  Use OTC Tylenol and Ibuprofen according to the package instructions as needed for pain.  Return for new or worsening symptoms.       ED Prescriptions     Medication Sig Dispense Auth. Provider   doxycycline (VIBRAMYCIN) 100 MG capsule Take 1 capsule (100 mg total) by mouth 2 (two) times daily. 20 capsule Margarette Canada, NP      PDMP not reviewed this encounter.   Margarette Canada, NP 03/06/23 1352

## 2023-03-06 NOTE — ED Triage Notes (Signed)
Pt presents with a painful bump on his buttocks x 3-4 days.

## 2023-03-06 NOTE — Discharge Instructions (Signed)
Take the Doxycycline twice daily with food for 10 days.  Doxycycline will make you more sensitive to sunburn so wear sunscreen when outdoors and reapply it every 90 minutes.  Apply warm compresses to help promote drainage.  Use OTC Tylenol and Ibuprofen according to the package instructions as needed for pain.  Return for new or worsening symptoms.   

## 2023-05-08 ENCOUNTER — Telehealth: Payer: Self-pay | Admitting: Family Medicine

## 2023-05-08 NOTE — Telephone Encounter (Signed)
Called, left Vm to call back in regards to scheduling an OV.

## 2023-05-08 NOTE — Telephone Encounter (Signed)
Patient called in after hours wanting an appt with Dr. Yetta Barre. Tried Calling Patient to schedule an office visit, no answer. Left patient a voicemail to call back and schedule an appt.

## 2023-08-13 ENCOUNTER — Ambulatory Visit
Admission: EM | Admit: 2023-08-13 | Discharge: 2023-08-13 | Disposition: A | Discharge status: Home / Self Care | Payer: Medicaid Other

## 2023-08-13 DIAGNOSIS — S61511A Laceration without foreign body of right wrist, initial encounter: Secondary | ICD-10-CM

## 2023-08-13 NOTE — ED Notes (Signed)
Patient came into [facility] waiting room with severe laceration to his right wrist/forearm.  911 was called.  Dr. Rachael Darby, and Becky Augusta, NP applied tourniqet and pressure dressing to laceration.  Patient alert and oriented X3.  Patient fell down a 108ft ladder at his home and cut his right arm on a metal stud.  Patient vomiting at this time.  EMS arrived to transport patient and neck collar applied.

## 2023-08-13 NOTE — ED Provider Notes (Signed)
[facility]    CSN: [id number] Arrival date & time: 08/13/23  1608      History   Chief Complaint No chief complaint on file.   HPI Kenneth Lucas is a 22 y.o. male.   HPI  22 year old male with a past medical history significant for Kawasaki disease and allergic rhinitis presents for evaluation of right wrist trauma.  He reports that he was up on a ladder and fell.  He tried to catch himself on a metal stud and sustained a large laceration to his right wrist with arterial bleed from the right radial artery.  Patient has numbness to his thumb and he is beginning to have contractures.  Past Medical History:  Diagnosis Date   Allergic rhinitis    Allergy    Heat rash    Kawasaki disease [facility])     Patient Active Problem List   Diagnosis Date Noted   Kawasaki disease (HCC) 04/04/2013    Past Surgical History:  Procedure Laterality Date   TESTICLE SURGERY     TONSILLECTOMY         Home Medications    Prior to Admission medications   Medication Sig Start Date End Date Taking? Authorizing Provider  dicyclomine (BENTYL) 10 MG capsule TAKE 1 CAPSULE BY MOUTH 4 TIMES DAILY - BEFORE MEALS AND AT BEDTIME. 01/22/21   Duanne Limerick, MD  doxycycline (VIBRAMYCIN) 100 MG capsule Take 1 capsule (100 mg total) by mouth 2 (two) times daily. 03/06/23   Becky Augusta, NP  omeprazole (PRILOSEC) 40 MG capsule Take 1 capsule (40 mg total) by mouth daily. 08/18/20 11/02/20  Pasty Spillers, MD  ondansetron (ZOFRAN-ODT) 4 MG disintegrating tablet Take 4 mg by mouth every 8 (eight) hours as needed. 03/01/21   [provider]    Family History Family History  Problem Relation Age of Onset   Diabetes Father    Heart disease Maternal Grandmother    Hypertension Maternal Grandmother    Heart disease Maternal Grandfather    Hypertension Maternal Grandfather     Social History Social History   Tobacco Use   Smoking status: Former    Types: E-cigarettes     Quit date: 01/06/2019    Years since quitting: 4.6   Smokeless tobacco: Never  Vaping Use   Vaping status: Never Used  Substance Use Topics   Alcohol use: Yes    Comment: minimal   Drug use: Yes    Types: Marijuana     Allergies   Patient has no known allergies.   Review of Systems Review of Systems   Physical Exam Triage Vital Signs ED Triage Vitals  Encounter Vitals Group     BP      Systolic BP Percentile      Diastolic BP Percentile      Pulse      Resp      Temp      Temp src      SpO2      Weight      Height      Head Circumference      Peak Flow      Pain Score      Pain Loc      Pain Education      Exclude from Growth Chart    No data found.  Updated Vital Signs There were no vitals taken for this visit.  Visual Acuity Right Eye Distance:   Left Eye Distance:   Bilateral Distance:  Right Eye Near:   Left Eye Near:    Bilateral Near:     Physical Exam   UC Treatments / Results  Labs (all labs ordered are listed, but only abnormal results are displayed) Labs Reviewed - No data to display  EKG   Radiology No results found.  Procedures Procedures (including critical care time)  Medications Ordered in UC Medications - No data to display  Initial Impression / Assessment and Plan / UC Course  I have reviewed the triage vital signs and the nursing notes.  Pertinent labs & imaging results that were available during my care of the patient were reviewed by me and considered in my medical decision making (see chart for details).  Asked to come to lobby by staff and found patient with a towel wrapped around his right arm with significant bleeding.  The towel was saturated and the blood was dripping on the floor.  Towel was removed to evaluate the wound and he was found have a large tissue defect in his volar wrist with arterial bleeding from his right radial artery.  Pressure was applied to stymied the bleeding followed by a blood pressure  cuff to the right upper arm inflated to 100 mmHg.  This was able to stem the arterial bleeding.  A pressure dressing was applied while EMS was called.  EMS arrived and he was placed in a collar and care was transferred to [facility] and patient was transferred to the ER.   Final Clinical Impressions(s) / UC Diagnoses   Final diagnoses:  Laceration of right wrist with complication, initial encounter     Discharge Instructions      Please go to the ER for evaluation of your right wrist laceration.     ED Prescriptions   None    PDMP not reviewed this encounter.   Becky Augusta, NP 08/13/23 [phone]

## 2023-08-13 NOTE — ED Notes (Signed)
Patient is being discharged from the Urgent Care and sent to the Emergency Department via EMS Right aterial Bleed due to severe laceration to right forearm . Per Dr. Rachael Darby, patient is in need of higher level of care due to Bleeding. Patient is aware and verbalizes understanding of plan of care. There were no vitals filed for this visit.

## 2023-08-13 NOTE — Discharge Instructions (Addendum)
Please go to the ER for evaluation of your right wrist laceration.
